# Patient Record
Sex: Female | Born: 1935 | Race: White | Hispanic: No | Marital: Married | Smoking: Never smoker
Health system: Southern US, Community
[De-identification: ages and names within clinical notes are randomized; demographics above are authoritative.]

## PROBLEM LIST (undated history)

## (undated) DIAGNOSIS — D509 Iron deficiency anemia, unspecified: Secondary | ICD-10-CM

## (undated) DIAGNOSIS — F419 Anxiety disorder, unspecified: Secondary | ICD-10-CM

## (undated) DIAGNOSIS — F32A Depression, unspecified: Secondary | ICD-10-CM

## (undated) DIAGNOSIS — C259 Malignant neoplasm of pancreas, unspecified: Secondary | ICD-10-CM

## (undated) DIAGNOSIS — E119 Type 2 diabetes mellitus without complications: Secondary | ICD-10-CM

## (undated) DIAGNOSIS — E039 Hypothyroidism, unspecified: Secondary | ICD-10-CM

## (undated) DIAGNOSIS — F329 Major depressive disorder, single episode, unspecified: Secondary | ICD-10-CM

## (undated) DIAGNOSIS — A0472 Enterocolitis due to Clostridium difficile, not specified as recurrent: Secondary | ICD-10-CM

## (undated) DIAGNOSIS — I429 Cardiomyopathy, unspecified: Secondary | ICD-10-CM

## (undated) DIAGNOSIS — D136 Benign neoplasm of pancreas: Secondary | ICD-10-CM

## (undated) DIAGNOSIS — I10 Essential (primary) hypertension: Secondary | ICD-10-CM

## (undated) HISTORY — PX: OTHER SURGICAL HISTORY: SHX169

## (undated) HISTORY — PX: ABDOMINAL HYSTERECTOMY: SHX81

## (undated) HISTORY — PX: GASTRIC BYPASS: SHX52

## (undated) HISTORY — PX: BACK SURGERY: SHX140

## (undated) HISTORY — PX: APPENDECTOMY: SHX54

## (undated) HISTORY — PX: CERVICAL FUSION: SHX112

## (undated) HISTORY — PX: CHOLECYSTECTOMY: SHX55

---

## 2004-11-10 ENCOUNTER — Ambulatory Visit: Payer: Self-pay | Admitting: Internal Medicine

## 2005-02-12 ENCOUNTER — Emergency Department: Payer: Self-pay | Admitting: Emergency Medicine

## 2005-04-14 ENCOUNTER — Ambulatory Visit: Payer: Self-pay | Admitting: Physician Assistant

## 2005-04-20 ENCOUNTER — Other Ambulatory Visit: Payer: Self-pay

## 2005-04-20 ENCOUNTER — Inpatient Hospital Stay: Payer: Self-pay | Admitting: Unknown Physician Specialty

## 2005-04-24 ENCOUNTER — Inpatient Hospital Stay: Payer: Self-pay | Admitting: Internal Medicine

## 2005-11-22 ENCOUNTER — Ambulatory Visit: Payer: Self-pay | Admitting: Internal Medicine

## 2006-10-27 ENCOUNTER — Inpatient Hospital Stay: Payer: Self-pay | Admitting: Internal Medicine

## 2006-10-27 ENCOUNTER — Other Ambulatory Visit: Payer: Self-pay

## 2007-08-10 ENCOUNTER — Ambulatory Visit: Payer: Self-pay | Admitting: Internal Medicine

## 2007-12-11 ENCOUNTER — Ambulatory Visit: Payer: Self-pay | Admitting: Internal Medicine

## 2008-02-26 ENCOUNTER — Ambulatory Visit: Payer: Self-pay | Admitting: Unknown Physician Specialty

## 2009-04-02 ENCOUNTER — Ambulatory Visit: Payer: Self-pay | Admitting: Internal Medicine

## 2009-06-17 ENCOUNTER — Emergency Department: Payer: Self-pay | Admitting: Emergency Medicine

## 2009-12-07 ENCOUNTER — Ambulatory Visit: Payer: Self-pay | Admitting: Internal Medicine

## 2010-01-17 ENCOUNTER — Emergency Department: Payer: Self-pay | Admitting: Emergency Medicine

## 2010-05-10 ENCOUNTER — Ambulatory Visit: Payer: Self-pay | Admitting: Internal Medicine

## 2010-09-21 ENCOUNTER — Ambulatory Visit: Payer: Self-pay | Admitting: Ophthalmology

## 2010-09-28 ENCOUNTER — Ambulatory Visit: Payer: Self-pay | Admitting: Internal Medicine

## 2010-10-04 ENCOUNTER — Ambulatory Visit: Payer: Self-pay | Admitting: Ophthalmology

## 2010-11-02 ENCOUNTER — Ambulatory Visit: Payer: Self-pay | Admitting: Ophthalmology

## 2010-11-08 ENCOUNTER — Ambulatory Visit: Payer: Self-pay | Admitting: Ophthalmology

## 2011-07-11 ENCOUNTER — Ambulatory Visit: Payer: Self-pay | Admitting: General Practice

## 2011-08-01 ENCOUNTER — Inpatient Hospital Stay: Payer: Self-pay | Admitting: General Practice

## 2011-08-02 LAB — BASIC METABOLIC PANEL
Anion Gap: 8 (ref 7–16)
BUN: 22 mg/dL — ABNORMAL HIGH (ref 7–18)
Calcium, Total: 7.8 mg/dL — ABNORMAL LOW (ref 8.5–10.1)
Co2: 26 mmol/L (ref 21–32)
EGFR (African American): 52 — ABNORMAL LOW
EGFR (Non-African Amer.): 43 — ABNORMAL LOW
Glucose: 91 mg/dL (ref 65–99)
Osmolality: 288 (ref 275–301)

## 2011-08-03 LAB — BASIC METABOLIC PANEL
Anion Gap: 9 (ref 7–16)
Chloride: 108 mmol/L — ABNORMAL HIGH (ref 98–107)
Creatinine: 0.87 mg/dL (ref 0.60–1.30)
EGFR (Non-African Amer.): >60
Glucose: 113 mg/dL — ABNORMAL HIGH (ref 65–99)
Osmolality: 284 (ref 275–301)
Potassium: 4.1 mmol/L (ref 3.5–5.1)
Sodium: 142 mmol/L (ref 136–145)

## 2011-08-03 LAB — PLATELET COUNT: Platelet: 200 10*3/uL (ref 150–440)

## 2011-08-05 ENCOUNTER — Encounter: Payer: Self-pay | Admitting: Internal Medicine

## 2011-08-17 ENCOUNTER — Emergency Department: Payer: Self-pay | Admitting: *Deleted

## 2011-08-17 LAB — URINALYSIS, COMPLETE
Nitrite: NEGATIVE
Ph: 5 (ref 4.5–8.0)
Protein: NEGATIVE
Squamous Epithelial: 8
Transitional Epi: <1

## 2011-08-17 LAB — COMPREHENSIVE METABOLIC PANEL
Albumin: 4 g/dL (ref 3.4–5.0)
Bilirubin,Total: 0.5 mg/dL (ref 0.2–1.0)
Creatinine: 1.69 mg/dL — ABNORMAL HIGH (ref 0.60–1.30)
EGFR (African American): 38 — ABNORMAL LOW
EGFR (Non-African Amer.): 31 — ABNORMAL LOW
Glucose: 114 mg/dL — ABNORMAL HIGH (ref 65–99)
Potassium: 3.9 mmol/L (ref 3.5–5.1)
SGPT (ALT): 16 U/L
Sodium: 138 mmol/L (ref 136–145)

## 2011-08-17 LAB — CBC
HCT: 29.1 % — ABNORMAL LOW (ref 35.0–47.0)
HGB: 9.4 g/dL — ABNORMAL LOW (ref 12.0–16.0)
MCV: 81 fL (ref 80–100)
RBC: 3.61 10*6/uL — ABNORMAL LOW (ref 3.80–5.20)
WBC: 7.9 10*3/uL (ref 3.6–11.0)

## 2011-08-17 LAB — TROPONIN I
Troponin-I: <0.02 ng/mL
Troponin-I: <0.02 ng/mL

## 2011-08-17 LAB — PROTIME-INR
INR: 1
Prothrombin Time: 13.4 secs (ref 11.5–14.7)

## 2011-08-17 LAB — CK TOTAL AND CKMB (NOT AT ARMC): CK-MB: 0.8 ng/mL (ref 0.5–3.6)

## 2011-08-17 LAB — APTT: Activated PTT: 34 secs (ref 23.6–35.9)

## 2011-08-26 ENCOUNTER — Encounter: Payer: Self-pay | Admitting: Internal Medicine

## 2011-10-17 ENCOUNTER — Ambulatory Visit: Payer: Self-pay | Admitting: Orthopedic Surgery

## 2011-10-17 LAB — CBC WITH DIFFERENTIAL/PLATELET
Basophil #: 0 10*3/uL (ref 0.0–0.1)
Eosinophil #: 0.2 10*3/uL (ref 0.0–0.7)
Eosinophil %: 2.4 %
HCT: 27.4 % — ABNORMAL LOW (ref 35.0–47.0)
HGB: 8.8 g/dL — ABNORMAL LOW (ref 12.0–16.0)
Lymphocyte #: 1.9 10*3/uL (ref 1.0–3.6)
MCH: 24.1 pg — ABNORMAL LOW (ref 26.0–34.0)
MCV: 75 fL — ABNORMAL LOW (ref 80–100)
Monocyte %: 8.6 %
Neutrophil #: 4.1 10*3/uL (ref 1.4–6.5)
Platelet: 342 10*3/uL (ref 150–440)
RDW: 15.1 % — ABNORMAL HIGH (ref 11.5–14.5)
WBC: 6.8 10*3/uL (ref 3.6–11.0)

## 2011-10-17 LAB — BASIC METABOLIC PANEL
Anion Gap: 10 (ref 7–16)
BUN: 25 mg/dL — ABNORMAL HIGH (ref 7–18)
Calcium, Total: 8.5 mg/dL (ref 8.5–10.1)
Chloride: 105 mmol/L (ref 98–107)
Co2: 26 mmol/L (ref 21–32)
Creatinine: 1.4 mg/dL — ABNORMAL HIGH (ref 0.60–1.30)
EGFR (Non-African Amer.): 39 — ABNORMAL LOW
Glucose: 129 mg/dL — ABNORMAL HIGH (ref 65–99)
Sodium: 141 mmol/L (ref 136–145)

## 2011-10-18 ENCOUNTER — Ambulatory Visit: Payer: Self-pay | Admitting: Orthopedic Surgery

## 2012-01-17 ENCOUNTER — Ambulatory Visit: Payer: Self-pay | Admitting: Internal Medicine

## 2012-01-19 ENCOUNTER — Ambulatory Visit: Payer: Self-pay | Admitting: Internal Medicine

## 2012-02-03 ENCOUNTER — Ambulatory Visit: Payer: Self-pay | Admitting: Internal Medicine

## 2012-08-08 ENCOUNTER — Ambulatory Visit: Payer: Self-pay | Admitting: Internal Medicine

## 2012-09-07 ENCOUNTER — Ambulatory Visit: Payer: Self-pay | Admitting: Radiation Oncology

## 2012-09-10 ENCOUNTER — Ambulatory Visit: Payer: Self-pay | Admitting: Radiation Oncology

## 2012-09-21 ENCOUNTER — Ambulatory Visit: Payer: Self-pay | Admitting: Internal Medicine

## 2012-09-22 ENCOUNTER — Ambulatory Visit: Payer: Self-pay | Admitting: Radiation Oncology

## 2012-09-25 LAB — CBC CANCER CENTER
Basophil %: 1.4 %
Eosinophil #: 0.2 x10 3/mm (ref 0.0–0.7)
Eosinophil %: 3 %
HCT: 32.7 % — ABNORMAL LOW (ref 35.0–47.0)
HGB: 10.8 g/dL — ABNORMAL LOW (ref 12.0–16.0)
Lymphocyte #: 1.8 x10 3/mm (ref 1.0–3.6)
MCHC: 33.2 g/dL (ref 32.0–36.0)
MCV: 82 fL (ref 80–100)
Monocyte #: 0.6 x10 3/mm (ref 0.2–0.9)
Monocyte %: 8.4 %
Neutrophil #: 4.1 x10 3/mm (ref 1.4–6.5)
Platelet: 287 x10 3/mm (ref 150–440)
RBC: 4.01 10*6/uL (ref 3.80–5.20)
RDW: 15 % — ABNORMAL HIGH (ref 11.5–14.5)
WBC: 6.8 x10 3/mm (ref 3.6–11.0)

## 2012-09-25 LAB — PROTIME-INR
INR: 0.9
Prothrombin Time: 12.6 secs (ref 11.5–14.7)

## 2012-09-25 LAB — COMPREHENSIVE METABOLIC PANEL
Albumin: 2.7 g/dL — ABNORMAL LOW (ref 3.4–5.0)
Alkaline Phosphatase: 101 U/L (ref 50–136)
Anion Gap: 9 (ref 7–16)
BUN: 10 mg/dL (ref 7–18)
Calcium, Total: 8.3 mg/dL — ABNORMAL LOW (ref 8.5–10.1)
Chloride: 101 mmol/L (ref 98–107)
Co2: 30 mmol/L (ref 21–32)
EGFR (African American): 48 — ABNORMAL LOW
Glucose: 183 mg/dL — ABNORMAL HIGH (ref 65–99)
Osmolality: 283 (ref 275–301)
SGOT(AST): 38 U/L — ABNORMAL HIGH (ref 15–37)
SGPT (ALT): 32 U/L (ref 12–78)
Sodium: 140 mmol/L (ref 136–145)
Total Protein: 6.7 g/dL (ref 6.4–8.2)

## 2012-10-23 ENCOUNTER — Ambulatory Visit: Payer: Self-pay | Admitting: Internal Medicine

## 2012-10-23 ENCOUNTER — Ambulatory Visit: Payer: Self-pay | Admitting: Radiation Oncology

## 2012-12-02 ENCOUNTER — Inpatient Hospital Stay (HOSPITAL_COMMUNITY)
Admission: EM | Admit: 2012-12-02 | Discharge: 2012-12-07 | DRG: 208 | Disposition: A | Discharge status: Home Health | Payer: Medicare Other | Attending: Internal Medicine | Admitting: Internal Medicine

## 2012-12-02 ENCOUNTER — Encounter (HOSPITAL_COMMUNITY): Payer: Self-pay | Admitting: *Deleted

## 2012-12-02 ENCOUNTER — Encounter (HOSPITAL_COMMUNITY)
Admission: EM | Disposition: A | Discharge status: Home Health | Payer: Self-pay | Source: Home / Self Care | Attending: Pulmonary Disease

## 2012-12-02 ENCOUNTER — Emergency Department (HOSPITAL_COMMUNITY): Payer: Medicare Other

## 2012-12-02 ENCOUNTER — Ambulatory Visit (HOSPITAL_COMMUNITY): Admit: 2012-12-02 | Payer: Self-pay | Admitting: Cardiovascular Disease

## 2012-12-02 DIAGNOSIS — I10 Essential (primary) hypertension: Secondary | ICD-10-CM | POA: Diagnosis present

## 2012-12-02 DIAGNOSIS — J811 Chronic pulmonary edema: Secondary | ICD-10-CM

## 2012-12-02 DIAGNOSIS — N390 Urinary tract infection, site not specified: Secondary | ICD-10-CM | POA: Diagnosis present

## 2012-12-02 DIAGNOSIS — E119 Type 2 diabetes mellitus without complications: Secondary | ICD-10-CM

## 2012-12-02 DIAGNOSIS — Z79899 Other long term (current) drug therapy: Secondary | ICD-10-CM

## 2012-12-02 DIAGNOSIS — E039 Hypothyroidism, unspecified: Secondary | ICD-10-CM

## 2012-12-02 DIAGNOSIS — E876 Hypokalemia: Secondary | ICD-10-CM

## 2012-12-02 DIAGNOSIS — I5031 Acute diastolic (congestive) heart failure: Secondary | ICD-10-CM

## 2012-12-02 DIAGNOSIS — E872 Acidosis, unspecified: Secondary | ICD-10-CM | POA: Diagnosis present

## 2012-12-02 DIAGNOSIS — J9600 Acute respiratory failure, unspecified whether with hypoxia or hypercapnia: Principal | ICD-10-CM | POA: Diagnosis present

## 2012-12-02 DIAGNOSIS — J189 Pneumonia, unspecified organism: Secondary | ICD-10-CM

## 2012-12-02 DIAGNOSIS — F411 Generalized anxiety disorder: Secondary | ICD-10-CM | POA: Diagnosis present

## 2012-12-02 DIAGNOSIS — A0472 Enterocolitis due to Clostridium difficile, not specified as recurrent: Secondary | ICD-10-CM | POA: Diagnosis present

## 2012-12-02 DIAGNOSIS — I428 Other cardiomyopathies: Secondary | ICD-10-CM | POA: Diagnosis present

## 2012-12-02 DIAGNOSIS — J9690 Respiratory failure, unspecified, unspecified whether with hypoxia or hypercapnia: Secondary | ICD-10-CM

## 2012-12-02 DIAGNOSIS — C259 Malignant neoplasm of pancreas, unspecified: Secondary | ICD-10-CM

## 2012-12-02 DIAGNOSIS — I509 Heart failure, unspecified: Secondary | ICD-10-CM | POA: Diagnosis present

## 2012-12-02 DIAGNOSIS — Z96659 Presence of unspecified artificial knee joint: Secondary | ICD-10-CM

## 2012-12-02 DIAGNOSIS — J9601 Acute respiratory failure with hypoxia: Secondary | ICD-10-CM

## 2012-12-02 DIAGNOSIS — J810 Acute pulmonary edema: Secondary | ICD-10-CM

## 2012-12-02 DIAGNOSIS — Z9884 Bariatric surgery status: Secondary | ICD-10-CM

## 2012-12-02 DIAGNOSIS — F329 Major depressive disorder, single episode, unspecified: Secondary | ICD-10-CM | POA: Diagnosis present

## 2012-12-02 DIAGNOSIS — I5043 Acute on chronic combined systolic (congestive) and diastolic (congestive) heart failure: Secondary | ICD-10-CM

## 2012-12-02 DIAGNOSIS — F3289 Other specified depressive episodes: Secondary | ICD-10-CM | POA: Diagnosis present

## 2012-12-02 DIAGNOSIS — I214 Non-ST elevation (NSTEMI) myocardial infarction: Secondary | ICD-10-CM

## 2012-12-02 DIAGNOSIS — D509 Iron deficiency anemia, unspecified: Secondary | ICD-10-CM | POA: Diagnosis present

## 2012-12-02 DIAGNOSIS — Z792 Long term (current) use of antibiotics: Secondary | ICD-10-CM

## 2012-12-02 HISTORY — DX: Cardiomyopathy, unspecified: I42.9

## 2012-12-02 HISTORY — DX: Type 2 diabetes mellitus without complications: E11.9

## 2012-12-02 HISTORY — DX: Benign neoplasm of pancreas: D13.6

## 2012-12-02 HISTORY — DX: Hypothyroidism, unspecified: E03.9

## 2012-12-02 HISTORY — DX: Malignant neoplasm of pancreas, unspecified: C25.9

## 2012-12-02 HISTORY — DX: Major depressive disorder, single episode, unspecified: F32.9

## 2012-12-02 HISTORY — DX: Depression, unspecified: F32.A

## 2012-12-02 HISTORY — DX: Essential (primary) hypertension: I10

## 2012-12-02 HISTORY — DX: Enterocolitis due to Clostridium difficile, not specified as recurrent: A04.72

## 2012-12-02 HISTORY — DX: Anxiety disorder, unspecified: F41.9

## 2012-12-02 HISTORY — DX: Iron deficiency anemia, unspecified: D50.9

## 2012-12-02 SURGERY — LEFT HEART CATHETERIZATION WITH CORONARY ANGIOGRAM
Anesthesia: LOCAL

## 2012-12-02 MED ORDER — SODIUM CHLORIDE 0.9 % IV SOLN
INTRAVENOUS | Status: DC
  Administered 2012-12-03: 01:00:00 via INTRAVENOUS

## 2012-12-02 MED ORDER — MIDAZOLAM BOLUS VIA INFUSION
1.0000 mg | INTRAVENOUS | Status: DC | PRN
  Filled 2012-12-02: qty 2

## 2012-12-02 MED ORDER — SUCCINYLCHOLINE CHLORIDE 20 MG/ML IJ SOLN
INTRAMUSCULAR | Status: AC
  Administered 2012-12-02: 100 mg
  Filled 2012-12-02: qty 1

## 2012-12-02 MED ORDER — ROCURONIUM BROMIDE 50 MG/5ML IV SOLN
INTRAVENOUS | Status: AC
  Filled 2012-12-02: qty 2

## 2012-12-02 MED ORDER — FENTANYL BOLUS VIA INFUSION
25.0000 ug | Freq: Four times a day (QID) | INTRAVENOUS | Status: DC | PRN
  Filled 2012-12-02: qty 100

## 2012-12-02 MED ORDER — FENTANYL CITRATE 0.05 MG/ML IJ SOLN
INTRAMUSCULAR | Status: AC
  Administered 2012-12-02: 100 ug
  Filled 2012-12-02: qty 2

## 2012-12-02 MED ORDER — SODIUM CHLORIDE 0.9 % IV SOLN
1.0000 mg/h | INTRAVENOUS | Status: DC
  Administered 2012-12-03: 1 mg/h via INTRAVENOUS
  Filled 2012-12-02 (×2): qty 10

## 2012-12-02 MED ORDER — MIDAZOLAM HCL 2 MG/2ML IJ SOLN
INTRAMUSCULAR | Status: AC
  Administered 2012-12-02: 2 mg
  Filled 2012-12-02: qty 2

## 2012-12-02 MED ORDER — FENTANYL CITRATE 0.05 MG/ML IJ SOLN
INTRAMUSCULAR | Status: AC
  Filled 2012-12-02: qty 2

## 2012-12-02 MED ORDER — LIDOCAINE HCL (CARDIAC) 20 MG/ML IV SOLN
INTRAVENOUS | Status: AC
  Filled 2012-12-02: qty 5

## 2012-12-02 MED ORDER — SODIUM CHLORIDE 0.9 % IV SOLN
25.0000 ug/h | INTRAVENOUS | Status: DC
  Administered 2012-12-03: 50 ug/h via INTRAVENOUS
  Filled 2012-12-02 (×2): qty 50

## 2012-12-02 MED ORDER — ETOMIDATE 2 MG/ML IV SOLN
INTRAVENOUS | Status: AC
  Administered 2012-12-02: 20 mg
  Filled 2012-12-02: qty 20

## 2012-12-02 NOTE — ED Notes (Signed)
Porta=cath   Accessed by [employer]

## 2012-12-02 NOTE — ED Notes (Signed)
succ 100mg  iv per jon rn

## 2012-12-02 NOTE — ED Notes (Signed)
Intubated bu the resp therapist.  Dr Bernette Mayers at bedside.  Good breath sounds bi-lateral breath sounds

## 2012-12-02 NOTE — ED Notes (Signed)
Preparing for intubation.  Dr Bernette Mayers

## 2012-12-02 NOTE — ED Notes (Signed)
amidate 20 mg iv per jon rn

## 2012-12-02 NOTE — ED Notes (Signed)
The pt arrived by gems from home sob and  And pain since 2130.  She arrived on c-pap resp therapy at bed side.  Cards in room.  stemi called by gems.  lethargic

## 2012-12-03 ENCOUNTER — Encounter (HOSPITAL_COMMUNITY): Payer: Self-pay | Admitting: Internal Medicine

## 2012-12-03 DIAGNOSIS — J189 Pneumonia, unspecified organism: Secondary | ICD-10-CM | POA: Diagnosis present

## 2012-12-03 DIAGNOSIS — E119 Type 2 diabetes mellitus without complications: Secondary | ICD-10-CM

## 2012-12-03 DIAGNOSIS — C259 Malignant neoplasm of pancreas, unspecified: Secondary | ICD-10-CM

## 2012-12-03 DIAGNOSIS — E039 Hypothyroidism, unspecified: Secondary | ICD-10-CM

## 2012-12-03 DIAGNOSIS — J9601 Acute respiratory failure with hypoxia: Secondary | ICD-10-CM | POA: Diagnosis present

## 2012-12-03 DIAGNOSIS — J9600 Acute respiratory failure, unspecified whether with hypoxia or hypercapnia: Secondary | ICD-10-CM

## 2012-12-03 DIAGNOSIS — I5042 Chronic combined systolic (congestive) and diastolic (congestive) heart failure: Secondary | ICD-10-CM

## 2012-12-03 DIAGNOSIS — J810 Acute pulmonary edema: Secondary | ICD-10-CM

## 2012-12-03 DIAGNOSIS — I509 Heart failure, unspecified: Secondary | ICD-10-CM

## 2012-12-03 HISTORY — DX: Hypothyroidism, unspecified: E03.9

## 2012-12-03 HISTORY — DX: Type 2 diabetes mellitus without complications: E11.9

## 2012-12-03 HISTORY — DX: Malignant neoplasm of pancreas, unspecified: C25.9

## 2012-12-03 LAB — TYPE AND SCREEN: DAT, IgG: NEGATIVE

## 2012-12-03 LAB — URINALYSIS, ROUTINE W REFLEX MICROSCOPIC
Ketones, ur: NEGATIVE mg/dL
Leukocytes, UA: NEGATIVE
Nitrite: NEGATIVE
Protein, ur: 100 mg/dL — AB

## 2012-12-03 LAB — COMPREHENSIVE METABOLIC PANEL WITH GFR
ALT: 30 U/L (ref 0–35)
AST: 42 U/L — ABNORMAL HIGH (ref 0–37)
Albumin: 2.3 g/dL — ABNORMAL LOW (ref 3.5–5.2)
Alkaline Phosphatase: 118 U/L — ABNORMAL HIGH (ref 39–117)
BUN: 12 mg/dL (ref 6–23)
CO2: 19 meq/L (ref 19–32)
Calcium: 8.3 mg/dL — ABNORMAL LOW (ref 8.4–10.5)
Chloride: 100 meq/L (ref 96–112)
Creatinine, Ser: 0.94 mg/dL (ref 0.50–1.10)
GFR calc Af Amer: 67 mL/min — ABNORMAL LOW
GFR calc non Af Amer: 57 mL/min — ABNORMAL LOW
Glucose, Bld: 286 mg/dL — ABNORMAL HIGH (ref 70–99)
Potassium: 4.2 meq/L (ref 3.5–5.1)
Sodium: 136 meq/L (ref 135–145)
Total Bilirubin: 0.4 mg/dL (ref 0.3–1.2)
Total Protein: 6.1 g/dL (ref 6.0–8.3)

## 2012-12-03 LAB — COMPREHENSIVE METABOLIC PANEL
AST: 51 U/L — ABNORMAL HIGH (ref 0–37)
Alkaline Phosphatase: 86 U/L (ref 39–117)
BUN: 12 mg/dL (ref 6–23)
CO2: 28 mEq/L (ref 19–32)
Chloride: 105 mEq/L (ref 96–112)
Creatinine, Ser: 1 mg/dL (ref 0.50–1.10)
GFR calc non Af Amer: 53 mL/min — ABNORMAL LOW (ref >90)
Potassium: 3.7 mEq/L (ref 3.5–5.1)
Total Bilirubin: 0.4 mg/dL (ref 0.3–1.2)

## 2012-12-03 LAB — POCT I-STAT 3, ART BLOOD GAS (G3+)
Acid-base deficit: 3 mmol/L — ABNORMAL HIGH (ref 0.0–2.0)
Patient temperature: 98.3
pH, Arterial: 7.295 — ABNORMAL LOW (ref 7.350–7.450)
pO2, Arterial: 78 mmHg — ABNORMAL LOW (ref 80.0–100.0)

## 2012-12-03 LAB — CBC WITH DIFFERENTIAL/PLATELET
Eosinophils Absolute: 0.3 10*3/uL (ref 0.0–0.7)
HCT: 31.2 % — ABNORMAL LOW (ref 36.0–46.0)
Hemoglobin: 9.9 g/dL — ABNORMAL LOW (ref 12.0–15.0)
Lymphs Abs: 6 10*3/uL — ABNORMAL HIGH (ref 0.7–4.0)
MCH: 26.1 pg (ref 26.0–34.0)
Monocytes Relative: 14 % — ABNORMAL HIGH (ref 3–12)
Neutro Abs: 4.3 10*3/uL (ref 1.7–7.7)
Neutrophils Relative %: 35 % — ABNORMAL LOW (ref 43–77)
RBC: 3.79 MIL/uL — ABNORMAL LOW (ref 3.87–5.11)

## 2012-12-03 LAB — CBC
HCT: 24.4 % — ABNORMAL LOW (ref 36.0–46.0)
MCV: 80.5 fL (ref 78.0–100.0)
RBC: 3.03 MIL/uL — ABNORMAL LOW (ref 3.87–5.11)
WBC: 5.3 10*3/uL (ref 4.0–10.5)

## 2012-12-03 LAB — LACTIC ACID, PLASMA: Lactic Acid, Venous: 1.9 mmol/L (ref 0.5–2.2)

## 2012-12-03 LAB — GLUCOSE, CAPILLARY: Glucose-Capillary: 94 mg/dL (ref 70–99)

## 2012-12-03 LAB — PROCALCITONIN: Procalcitonin: 0.93 ng/mL

## 2012-12-03 LAB — CLOSTRIDIUM DIFFICILE BY PCR: Toxigenic C. Difficile by PCR: NEGATIVE

## 2012-12-03 LAB — PRO B NATRIURETIC PEPTIDE: Pro B Natriuretic peptide (BNP): 26162 pg/mL — ABNORMAL HIGH (ref 0–450)

## 2012-12-03 LAB — URINE MICROSCOPIC-ADD ON

## 2012-12-03 LAB — PROTIME-INR: INR: 1.19 (ref 0.00–1.49)

## 2012-12-03 MED ORDER — PANTOPRAZOLE SODIUM 40 MG IV SOLR
40.0000 mg | Freq: Every day | INTRAVENOUS | Status: DC
  Filled 2012-12-03: qty 40

## 2012-12-03 MED ORDER — FAMOTIDINE 40 MG/5ML PO SUSR
20.0000 mg | Freq: Two times a day (BID) | ORAL | Status: DC
  Administered 2012-12-03 – 2012-12-04 (×3): 20 mg via ORAL
  Filled 2012-12-03 (×4): qty 2.5

## 2012-12-03 MED ORDER — VANCOMYCIN 50 MG/ML ORAL SOLUTION
125.0000 mg | Freq: Four times a day (QID) | ORAL | Status: DC
  Administered 2012-12-03 – 2012-12-06 (×13): 125 mg via NASOGASTRIC
  Filled 2012-12-03 (×19): qty 2.5

## 2012-12-03 MED ORDER — VANCOMYCIN HCL IN DEXTROSE 750-5 MG/150ML-% IV SOLN
750.0000 mg | Freq: Two times a day (BID) | INTRAVENOUS | Status: DC
  Filled 2012-12-03: qty 150

## 2012-12-03 MED ORDER — PIPERACILLIN-TAZOBACTAM 3.375 G IVPB
3.3750 g | Freq: Three times a day (TID) | INTRAVENOUS | Status: DC
  Administered 2012-12-03 – 2012-12-07 (×13): 3.375 g via INTRAVENOUS
  Filled 2012-12-03 (×16): qty 50

## 2012-12-03 MED ORDER — ARIPIPRAZOLE 5 MG PO TABS
5.0000 mg | ORAL_TABLET | Freq: Every day | ORAL | Status: DC
  Administered 2012-12-03 – 2012-12-07 (×5): 5 mg via ORAL
  Filled 2012-12-03 (×5): qty 1

## 2012-12-03 MED ORDER — CLONAZEPAM 0.5 MG PO TABS
0.5000 mg | ORAL_TABLET | Freq: Two times a day (BID) | ORAL | Status: DC | PRN
  Administered 2012-12-04 – 2012-12-06 (×3): 0.5 mg via ORAL
  Filled 2012-12-03 (×3): qty 1

## 2012-12-03 MED ORDER — LEVOTHYROXINE SODIUM 112 MCG PO TABS
112.0000 ug | ORAL_TABLET | Freq: Every day | ORAL | Status: DC
  Administered 2012-12-03 – 2012-12-07 (×5): 112 ug via NASOGASTRIC
  Filled 2012-12-03 (×7): qty 1

## 2012-12-03 MED ORDER — PIPERACILLIN-TAZOBACTAM 3.375 G IVPB
3.3750 g | Freq: Once | INTRAVENOUS | Status: AC
  Administered 2012-12-03: 3.375 g via INTRAVENOUS
  Filled 2012-12-03: qty 50

## 2012-12-03 MED ORDER — HEPARIN SODIUM (PORCINE) 5000 UNIT/ML IJ SOLN
5000.0000 [IU] | Freq: Three times a day (TID) | INTRAMUSCULAR | Status: DC
  Administered 2012-12-03 – 2012-12-07 (×13): 5000 [IU] via SUBCUTANEOUS
  Filled 2012-12-03 (×16): qty 1

## 2012-12-03 MED ORDER — SODIUM CHLORIDE 0.9 % IV SOLN
250.0000 mL | INTRAVENOUS | Status: DC | PRN
  Administered 2012-12-03: 500 mL via INTRAVENOUS
  Administered 2012-12-05: 250 mL via INTRAVENOUS

## 2012-12-03 MED ORDER — BIOTENE DRY MOUTH MT LIQD
15.0000 mL | Freq: Four times a day (QID) | OROMUCOSAL | Status: DC
  Administered 2012-12-04 (×2): 15 mL via OROMUCOSAL

## 2012-12-03 MED ORDER — CHLORHEXIDINE GLUCONATE CLOTH 2 % EX PADS
6.0000 | MEDICATED_PAD | Freq: Every day | CUTANEOUS | Status: AC
  Administered 2012-12-03 – 2012-12-07 (×5): 6 via TOPICAL

## 2012-12-03 MED ORDER — FUROSEMIDE 10 MG/ML IJ SOLN
80.0000 mg | Freq: Once | INTRAMUSCULAR | Status: AC
  Administered 2012-12-03: 80 mg via INTRAVENOUS
  Filled 2012-12-03: qty 8

## 2012-12-03 MED ORDER — ALBUTEROL SULFATE HFA 108 (90 BASE) MCG/ACT IN AERS: 4.0000 | INHALATION_SPRAY | RESPIRATORY_TRACT | Status: DC | PRN

## 2012-12-03 MED ORDER — MUPIROCIN 2 % EX OINT
1.0000 "application " | TOPICAL_OINTMENT | Freq: Two times a day (BID) | CUTANEOUS | Status: DC
  Administered 2012-12-03 – 2012-12-07 (×8): 1 via NASAL
  Filled 2012-12-03: qty 22

## 2012-12-03 MED ORDER — VANCOMYCIN HCL 500 MG IV SOLR
500.0000 mg | Freq: Two times a day (BID) | INTRAVENOUS | Status: DC
  Administered 2012-12-03 – 2012-12-05 (×4): 500 mg via INTRAVENOUS
  Filled 2012-12-03 (×6): qty 500

## 2012-12-03 MED ORDER — VANCOMYCIN HCL IN DEXTROSE 1-5 GM/200ML-% IV SOLN
1000.0000 mg | Freq: Once | INTRAVENOUS | Status: AC
  Administered 2012-12-03: 1000 mg via INTRAVENOUS
  Filled 2012-12-03: qty 200

## 2012-12-03 MED ORDER — FENTANYL CITRATE 0.05 MG/ML IJ SOLN
25.0000 ug | INTRAMUSCULAR | Status: DC | PRN
  Administered 2012-12-03: 100 ug via INTRAVENOUS
  Administered 2012-12-04: 50 ug via INTRAVENOUS
  Administered 2012-12-04 (×2): 100 ug via INTRAVENOUS
  Filled 2012-12-03 (×4): qty 2

## 2012-12-03 MED ORDER — FUROSEMIDE 10 MG/ML IJ SOLN
40.0000 mg | Freq: Two times a day (BID) | INTRAMUSCULAR | Status: DC
  Administered 2012-12-03 – 2012-12-05 (×4): 40 mg via INTRAVENOUS
  Filled 2012-12-03 (×6): qty 4

## 2012-12-03 MED ORDER — CHLORHEXIDINE GLUCONATE 0.12 % MT SOLN
15.0000 mL | Freq: Two times a day (BID) | OROMUCOSAL | Status: DC
  Administered 2012-12-03 – 2012-12-04 (×2): 15 mL via OROMUCOSAL
  Filled 2012-12-03 (×2): qty 15

## 2012-12-03 MED ORDER — INSULIN ASPART 100 UNIT/ML ~~LOC~~ SOLN
2.0000 [IU] | SUBCUTANEOUS | Status: DC
  Administered 2012-12-03 – 2012-12-04 (×2): 4 [IU] via SUBCUTANEOUS
  Administered 2012-12-05: 2 [IU] via SUBCUTANEOUS
  Administered 2012-12-05 – 2012-12-06 (×4): 4 [IU] via SUBCUTANEOUS

## 2012-12-03 NOTE — H&P (Signed)
PULMONARY  / CRITICAL CARE MEDICINE  Name: Tonya Wallace MRN: [id number] DOB: Apr 24, 1935    ADMISSION DATE:  12/02/2012  PRIMARY SERVICE: PCCM  CHIEF COMPLAINT:  Acute respiratory failure.  BRIEF PATIENT DESCRIPTION:  78 years old female with pancreatic cancer. Presents with sudden onset of SOB. At admission found profoundly hypoxemic requiring intubation. Chest X ray with bilateral infiltrates.    LINES / TUBES: - Peripheral IV's - Right port- A-cath  CULTURES: - Blood cultures sent - Urine cultures sent - Will order respiratory cultures.   ANTIBIOTICS: - Zosyn - Vancomycin  HISTORY OF PRESENT ILLNESS:   78 years old female with PMH relevant for pancreatic cancer, DM, hypothyroidism and depression. Presents with sudden onset of SOB. Famile denies any cough, sputum production or fever. At arrival to the ED profoundly hypoxemic requiring intubation. Chest X ray with bilateral infiltrates suggestive of multifocal pneumonia vs CHF. BNP [id number]. At the time of my exam the patient is intubated, sedated, hemodynamically stable. Got Lasix 80 mg IV in the ED.  PAST MEDICAL HISTORY :  Past Medical History  Diagnosis Date  . Pancreatic adenoma   . Pancreatic cancer 12/03/2012  . Diabetes mellitus 12/03/2012  . Hypothyroidism 12/03/2012  . Clostridium difficile enterocolitis   . Depression    No past surgical history on file. Prior to Admission medications   Medication Sig Start Date End Date Taking? Authorizing Provider  aliskiren (TEKTURNA) 300 MG tablet Take 300 mg by mouth daily.   Yes Historical Provider, MD  Alum & Mag Hydroxide-Simeth (MAGIC MOUTHWASH) SOLN Take 15 mLs by mouth 4 (four) times daily.   Yes Historical Provider, MD  ARIPiprazole (ABILIFY) 5 MG tablet Take 5 mg by mouth daily.   Yes Historical Provider, MD  ciprofloxacin (CIPRO) 500 MG tablet Take 500 mg by mouth every 12 (twelve) hours. For 13 days 11/28/12 12/12/12 Yes Historical Provider, MD  clonazePAM  (KLONOPIN) 0.5 MG tablet Take 0.5 mg by mouth 2 (two) times daily as needed for anxiety.   Yes Historical Provider, MD  diphenoxylate-atropine (LOMOTIL) 2.5-0.025 MG per tablet Take 1 tablet by mouth 4 (four) times daily as needed for diarrhea or loose stools.   Yes Historical Provider, MD  escitalopram (LEXAPRO) 10 MG tablet Take 10 mg by mouth daily.   Yes Historical Provider, MD  furosemide (LASIX) 20 MG tablet Take 20 mg by mouth daily.   Yes Historical Provider, MD  levothyroxine (SYNTHROID, LEVOTHROID) 112 MCG tablet Take 112 mcg by mouth daily before breakfast.   Yes Historical Provider, MD  lidocaine-prilocaine (EMLA) cream Apply 1 application topically as needed (Apply 30 gm 45 minutes before port).   Yes Historical Provider, MD  lipase/protease/amylase (CREON-12/PANCREASE) 12000 UNITS CPEP Take 1 capsule by mouth 3 (three) times daily with meals.   Yes Historical Provider, MD  Multiple Vitamin (MULTIVITAMIN WITH MINERALS) TABS Take 1 tablet by mouth daily.   Yes Historical Provider, MD  nebivolol (BYSTOLIC) 5 MG tablet Take 5 mg by mouth daily.   Yes Historical Provider, MD  ondansetron (ZOFRAN) 8 MG tablet Take by mouth every 8 (eight) hours as needed for nausea.   Yes Historical Provider, MD  solifenacin (VESICARE) 10 MG tablet Take 10 mg by mouth daily.   Yes Historical Provider, MD  traMADol (ULTRAM) 50 MG tablet Take 50 mg by mouth every 6 (six) hours as needed for pain.   Yes Historical Provider, MD  Vancomycin HCl 25 MG/ML SOLN Take 125 mg by mouth every 6 (six)  hours. For 21 days 11/28/12 12/19/12 Yes Historical Provider, MD  zolpidem (AMBIEN CR) 6.25 MG CR tablet Take 6.25 mg by mouth at bedtime as needed for sleep.   Yes Historical Provider, MD   Allergies  Allergen Reactions  . Fentanyl Itching    FAMILY HISTORY:  No family history on file. SOCIAL HISTORY:  reports that she has never smoked. She does not have any smokeless tobacco history on file. She reports that she does  not drink alcohol. Her drug history is not on file.  REVIEW OF SYSTEMS:  Unable to provide  SUBJECTIVE:   VITAL SIGNS: Temp:  [98.1 F (36.7 C)-98.9 F (37.2 C)] 98.4 F (36.9 C) (05/12 0236) Pulse Rate:  [76-134] 76 (05/12 0236) Resp:  [28-33] 28 (05/11 2320) BP: (88-181)/(47-100) 114/59 mmHg (05/12 0236) SpO2:  [60 %-99 %] 99 % (05/12 0236) FiO2 (%):  [100 %] 100 % (05/11 2320) HEMODYNAMICS:   VENTILATOR SETTINGS: Vent Mode:  [-] PRVC FiO2 (%):  [100 %] 100 % Set Rate:  [16 bmp] 16 bmp Vt Set:  [370 mL] 370 mL PEEP:  [8 cmH20] 8 cmH20 Plateau Pressure:  [24 cmH20] 24 cmH20 INTAKE / OUTPUT: Intake/Output   None     PHYSICAL EXAMINATION: General: No apparent distress. Intubated. Eyes: Anicteric sclerae. ENT: ETT in place. Trachea at midline. Lymph: No cervical, supraclavicular, or axillary lymphadenopathy. Heart: Normal S1, S2. No murmurs, rubs, or gallops appreciated. No bruits, equal pulses. Lungs: Bilateral diffuse crackles. No wheezing. Abdomen: Abdomen soft, non-tender and not distended, normoactive bowel sounds. Biliary drainage in place.  Musculoskeletal: No clubbing or synovitis. 3+ LE edema. Skin: No rashes or lesions Neuro: Sedated. Open eyes on voice command. Moves all 4 extremities. LABS:  Recent Labs Lab 12/02/12 2349 12/03/12 0031  HGB 9.9*  --   WBC 12.4*  --   PLT 330  --   NA 136  --   K 4.2  --   CL 100  --   CO2 19  --   GLUCOSE 286*  --   BUN 12  --   CREATININE 0.94  --   CALCIUM 8.3*  --   AST 42*  --   ALT 30  --   ALKPHOS 118*  --   BILITOT 0.4  --   PROT 6.1  --   ALBUMIN 2.3*  --   APTT 32  --   INR 1.19  --   TROPONINI <0.30  --   PROBNP 26162.0*  --   PHART  --  7.295*  PCO2ART  --  48.1*  PO2ART  --  78.0*   No results found for this basename: GLUCAP,  in the last 168 hours  CXR:  Bilateral patchy infiltrates worse on the right. Likely pneumonia but cannot rule out CHF.   ASSESSMENT / PLAN:  PULMONARY A: 1)  Acute hypoxemic respiratory failure / ARDS 2) HCAP but unable to rule out pulmonary edema given markedly high BNP. Bedside US with depressed LVEF probably about  40% P:   - Admit to ICU - Mechanical ventilation   - PRVC, Vt: 6cc/kg, PEEP: 8, RR: 18, FiO2: 100% and adjust to keep O2 sat > 94% - VAP prevention order set - Zosyn - Vancomycin - Tracheal aspirate cultures - Albuterol PRN  CARDIOVASCULAR A:  1) Acute decompensated heart failure.  2) Hypertensive with SBP in the 180's at admission. P:  - Lasix 40 mg IV BID if BP tolerates - Echocardiogram in am  RENAL  A:   1) No issues P:   - Will follow CMP  GASTROINTESTINAL A:   1) Pancreatic cancer 2) History of C. Diff on PO vancomycin P:   - GI prophylaxis with protonix IV - We will continue vancomycin per NGT  HEMATOLOGIC A:   1) No issues P:  - DVT prophylaxis with SQ heparin and SCD's - Will follow CBC  INFECTIOUS A:   1) HCAP P:   - Antibiotics as above - We will follow cultures  ENDOCRINE A:   1) DM 2) Hypothyroidism P:   - ICU hyperglycemia protocol with SQ insulin SS  NEUROLOGIC A:   1) No issues P:   - Continuous sedation with Versed and Fentanyl   I have personally obtained a history, examined the patient, evaluated laboratory and imaging results, formulated the assessment and plan and placed orders. CRITICAL CARE: Critical Care Time devoted to patient care services described in this note is 60 minutes.   Overton Mam, MD Pulmonary and Critical Care Medicine [facility] Pager: [phone]  12/03/2012, 3:02 AM

## 2012-12-03 NOTE — ED Notes (Signed)
The pt s family at the bedside husband and care giver.  The pt is currently getting chemo therapy.  Tonight she had abd pain and sob earlier.  Foley cath placed temp foley  Urine sent for routine and culture

## 2012-12-03 NOTE — Progress Notes (Signed)
CRITICAL VALUE ALERT  Critical value received:  troponin  Date of notification:  12/03/12  Time of notification:  1815  Critical value read back:yes  Nurse who received alert:  D. Robb Matar   MD notified (1st page):  Delford Field  Time of first page:  1815  MD notified (2nd page):  Time of second page:  Responding MD:  Delford Field  Time MD responded:  [phone]

## 2012-12-03 NOTE — Progress Notes (Signed)
CRITICAL VALUE ALERT  Critical value received:  PCR MRSA nasal swab positive  Date of notification:  12/03/12  Time of notification:  0547  Critical value read back: yes  Nurse who received alert:  Dustin Flock RN  MD notified (1st page):  Dr Deterding  Time of first page:  0615  MD notified (2nd page):  Time of second page:  Responding MD:  Dr Darrick Penna  Time MD responded:  [phone]

## 2012-12-03 NOTE — ED Notes (Signed)
Family at the bedside . The ptsd

## 2012-12-03 NOTE — Progress Notes (Addendum)
Pt has already been seen in consultation this afternoon.   She presented with respiratory failure.  Troponin levels are now positive.  She is intubated and is on the vent.  ECG from this afternoon reveals poor R wave progression - ? Ant. MI.  And TWI inferiorly.    Echo this afternoon shows no segmental WMA with EF 55%.  Exam:  Lungs : on vent  Heart:  RR 2/6 systolic murmur to axilla  Ext:  Trace - 1+ edema  Neuro: sedated  This may represent an acute MI but we also need to consider Troponin elevation from CHF or Pulmonary embolus.  Will check ECG.  D-dimer.   Our team will see her in the AM.    Alvia Grove., MD, [facility] 12/03/2012, 7:53 PM Office - [phone] Pager [phone]

## 2012-12-03 NOTE — Progress Notes (Signed)
ANTIBIOTIC CONSULT NOTE - INITIAL  Pharmacy Consult for vancomycin and zosyn Indication: rule out pneumonia  Allergies  Allergen Reactions  . Fentanyl Itching    Patient Measurements: Height: 5\' 2"  (157.5 cm) Weight: 152 lb 1.9 oz (69 kg) IBW/kg (Calculated) : 50.1 Adjusted Body Weight:   Vital Signs: Temp: 98.4 F (36.9 C) (05/12 0300) Temp src: Other (Comment) (05/12 0042) BP: 103/54 mmHg (05/12 0300) Pulse Rate: 72 (05/12 0300) Intake/Output from previous day:   Intake/Output from this shift:    Labs:  Recent Labs  12/02/12 2349  WBC 12.4*  HGB 9.9*  PLT 330  CREATININE 0.94   Estimated Creatinine Clearance: 46.4 ml/min (by C-G formula based on Cr of 0.94). No results found for this basename: VANCOTROUGH, VANCOPEAK, VANCORANDOM, GENTTROUGH, GENTPEAK, GENTRANDOM, TOBRATROUGH, TOBRAPEAK, TOBRARND, AMIKACINPEAK, AMIKACINTROU, AMIKACIN,  in the last 72 hours   Microbiology: No results found for this or any previous visit (from the past 720 hour(s)).  Medical History: Past Medical History  Diagnosis Date  . Pancreatic adenoma   . Pancreatic cancer 12/03/2012  . Diabetes mellitus 12/03/2012  . Hypothyroidism 12/03/2012  . Clostridium difficile enterocolitis   . Depression     Medications:  Prescriptions prior to admission  Medication Sig Dispense Refill  . aliskiren (TEKTURNA) 300 MG tablet Take 300 mg by mouth daily.      . Alum & Mag Hydroxide-Simeth (MAGIC MOUTHWASH) SOLN Take 15 mLs by mouth 4 (four) times daily.      . ARIPiprazole (ABILIFY) 5 MG tablet Take 5 mg by mouth daily.      . ciprofloxacin (CIPRO) 500 MG tablet Take 500 mg by mouth every 12 (twelve) hours. For 13 days      . clonazePAM (KLONOPIN) 0.5 MG tablet Take 0.5 mg by mouth 2 (two) times daily as needed for anxiety.      . diphenoxylate-atropine (LOMOTIL) 2.5-0.025 MG per tablet Take 1 tablet by mouth 4 (four) times daily as needed for diarrhea or loose stools.      Marland Kitchen escitalopram  (LEXAPRO) 10 MG tablet Take 10 mg by mouth daily.      . furosemide (LASIX) 20 MG tablet Take 20 mg by mouth daily.      Marland Kitchen levothyroxine (SYNTHROID, LEVOTHROID) 112 MCG tablet Take 112 mcg by mouth daily before breakfast.      . lidocaine-prilocaine (EMLA) cream Apply 1 application topically as needed (Apply 30 gm 45 minutes before port).      . lipase/protease/amylase (CREON-12/PANCREASE) 12000 UNITS CPEP Take 1 capsule by mouth 3 (three) times daily with meals.      . Multiple Vitamin (MULTIVITAMIN WITH MINERALS) TABS Take 1 tablet by mouth daily.      . nebivolol (BYSTOLIC) 5 MG tablet Take 5 mg by mouth daily.      . ondansetron (ZOFRAN) 8 MG tablet Take by mouth every 8 (eight) hours as needed for nausea.      . solifenacin (VESICARE) 10 MG tablet Take 10 mg by mouth daily.      . traMADol (ULTRAM) 50 MG tablet Take 50 mg by mouth every 6 (six) hours as needed for pain.      . Vancomycin HCl 25 MG/ML SOLN Take 125 mg by mouth every 6 (six) hours. For 21 days      . zolpidem (AMBIEN CR) 6.25 MG CR tablet Take 6.25 mg by mouth at bedtime as needed for sleep.       Assessment: 78 yo female with pancreatic cancer  with sudden onset sob and hypoxemia requiring intubation. Chest xray reveals bilateral infiltrates. Vanc and zoysn for wide spec coverage vanc 1gm give at 0100 zosyn at 0230   Goal of Therapy:  Vancomycin trough level 15-20 mcg/ml  Plan: vancomycin 750 mg q12 next dose at noon   Zosyn 3.375 gm q8h (4 hour infusion)    Janice Coffin 12/03/2012,4:22 AM

## 2012-12-03 NOTE — Care Management Note (Addendum)
    Page 1 of 2   12/07/2012     10:38:50 AM   CARE MANAGEMENT NOTE 12/07/2012  Patient:  Tonya Wallace, Tonya Wallace   Account Number:  [id number]  Date Initiated:  12/03/2012  Documentation initiated by:  Junius Creamer  Subjective/Objective Assessment:   adm w resp failure, on vent     Action/Plan:   lives w husband   Anticipated DC Date:  12/07/2012   Anticipated DC Plan:  HOME W HOME HEALTH SERVICES      DC Planning Services  CM consult      [facility] Choice  HOME HEALTH  Resumption Of Svcs/PTA Provider   Choice offered to / List presented to:  C-1 Patient        HH arranged  HH-1 RN  HH-10 DISEASE MANAGEMENT  HH-2 PT      HH agency  [facility]   Status of service:  Completed, signed off Medicare Important Message given?   (If response is "NO", the following Medicare IM given date fields will be blank) Date Medicare IM given:   Date Additional Medicare IM given:    Discharge Disposition:  HOME W HOME HEALTH SERVICES  Per UR Regulation:  Reviewed for med. necessity/level of care/duration of stay  If discussed at Long Length of Stay Meetings, dates discussed:    Comments:  12/07/12 [name] [phone] PT FOR DC HOME TODAY.  PT ACTIVE WITH [facility] PTA FOR HHRN, PER PT.  WILL NEED HHPT ADDED, AS ORDERED BY MD.  NOTIFIED [facility] OF DC TODAY AND NEW ORDERS.  START OF CARE 24-48H POST DC DATE.

## 2012-12-03 NOTE — Progress Notes (Signed)
Echocardiogram 2D Echocardiogram has been performed.  Star Cheese 12/03/2012, 9:39 AM

## 2012-12-03 NOTE — ED Notes (Signed)
Admitting doctor here to see.  Husbands number given to the doctor

## 2012-12-03 NOTE — ED Notes (Signed)
The pt s eyes open to there name being called.  No admitting doctor has seen yet.  Her bp is steadily decreasing

## 2012-12-03 NOTE — ED Notes (Signed)
pts husband s phone number [phone]

## 2012-12-03 NOTE — Progress Notes (Signed)
Paged Cardiology per CCM request to report elevated troponins.

## 2012-12-03 NOTE — Consult Note (Signed)
CARDIOLOGY CONSULT NOTE  Patient ID: GIZEL RIEDLINGER MRN: [id number], DOB/AGE: [birth year]   Admit date: 12/02/2012 Date of Consult: 12/03/2012  Primary Physician: Provider Not In System Primary Cardiologist: New  Pt. Profile  78 y/o female with h/o cardiomyopathy with an EF of 40-45% by echo @ [facility] last month, whom we've been asked to eval 2/2 resp failure and chf.  Problem List  Past Medical History  Diagnosis Date  . Pancreatic adenoma   . Pancreatic cancer 12/03/2012  . Diabetes mellitus 12/03/2012  . Hypothyroidism 12/03/2012  . Clostridium difficile enterocolitis   . Depression   . Cardiomyopathy     a. 10/2012 Echo: EF 40-45%, basal inf, inflat, infsept HK, aortic sclerosis, mildly dil LA, mod MR, mild TR, mod PAH;  b. 11/2012 Echo: EF 50-55%, no rwma, gr 3 DD, Ao sclerosis, Mild MVP w mild to mod MR, mildly dil LA.  Marland Kitchen Hypertension   . Anxiety   . Iron deficiency anemia     a. h/o transfusion, most recently 10/2012 - [facility]    Past Surgical History  Procedure Laterality Date  . Gastric bypass    . Abdominal hysterectomy    . Back surgery    . Cervical fusion    . Right total knee replacement    . Cholecystectomy    . Appendectomy    . Bilateral cataract surgeries      Allergies  Allergies  Allergen Reactions  . Fentanyl Itching   HPI   78 y/o female with the above complex problem list.  She has been treated with chemo @ [facility] for pancreatic CA recently.  She was also admitted to [facility] in April for C diff and resp failure.  During that admission she underwent an echo that showed an EF of 40-45% with wall motion abnormalities as outlined above.  I was not able to find a cardiology consult note or other testing that may have resulted from finding reduced LV fxn in care everywhere.  Per Onc note on 5/1, she was doing reasonably well, though she continued to suffer from anemia and required an outpt transfusion of 2 units prbc's on 5/9.  At that visit, she weighed 145  lbs.  Pt presented to the ED last night with acute resp failure requiring intubation.  CXR suggested bilat pna and she has been treated with abx.  ProBNP was >26K and she has also been treated for acute on chronic chf.  Echo was repeated earlier today, and shows EF of 50-55% without wall motion abnormalities.  We've been asked to eval.  She remains intubated and is not able to provide history at this time.  Inpatient Medications  . ARIPiprazole  5 mg Oral Daily  . Chlorhexidine Gluconate Cloth  6 each Topical Q0600  . famotidine  20 mg Oral BID  . furosemide  40 mg Intravenous Q12H  . heparin  5,000 Units Subcutaneous Q8H  . insulin aspart  2-6 Units Subcutaneous Q4H  . levothyroxine  112 mcg Per NG tube QAC breakfast  . mupirocin ointment  1 application Nasal BID  . piperacillin-tazobactam (ZOSYN)  IV  3.375 g Intravenous Q8H  . vancomycin  125 mg Per NG tube Q6H  . vancomycin  500 mg Intravenous Q12H   Family History- unable to obtain fully 2/2 intubated status. No family history on file.   Social History - unable to obtain fully 2/2 intubated status. History   Social History  . Marital Status: Married    Spouse  Name: N/A    Number of Children: N/A  . Years of Education: N/A   Occupational History  . Not on file.   Social History Main Topics  . Smoking status: Never Smoker   . Smokeless tobacco: Not on file  . Alcohol Use: No  . Drug Use: Not on file  . Sexually Active: Not on file   Other Topics Concern  . Not on file   Social History Narrative   Lives in [place] with her husband.     Review of Systems - unable to obtain 2/2 intubated status.  General:  No chills, fever, night sweats or weight changes.  Cardiovascular:  No chest pain, dyspnea on exertion, edema, orthopnea, palpitations, paroxysmal nocturnal dyspnea. Dermatological: No rash, lesions/masses Respiratory: No cough, dyspnea Urologic: No hematuria, dysuria Abdominal:   No nausea, vomiting,  diarrhea, bright red blood per rectum, melena, or hematemesis Neurologic:  No visual changes, wkns, changes in mental status. All other systems reviewed and are otherwise negative except as noted above.  Physical Exam  Blood pressure 107/53, pulse 64, temperature 99.6 F (37.6 C), temperature source Core (Comment), resp. rate 24, height 5\' 2"  (1.575 m), weight 151 lb 7.3 oz (68.7 kg), SpO2 98.00%.  General: intubated.  Opens eyes to voice and acknowledges my presence. Psych: flat affect. Neuro: intubated.  groggy. Moves all extremities spontaneously and able to follow commands. HEENT: Normal  Neck: Supple without bruits.  JVD to jaw. Lungs:  Resp regular and unlabored, rhonchi throughout. Heart: RRR, distant, no s3, s4, or murmurs. Abdomen: Soft, non-tender, non-distended, BS + x 4.  Extremities: No clubbing, cyanosis.  1+ bilat pedal edema. DP/PT/Radials 2+ and equal bilaterally.  Labs   Recent Labs  12/02/12 2349  TROPONINI <0.30   Lab Results  Component Value Date   WBC 5.3 12/03/2012   HGB 7.9* 12/03/2012   HCT 24.4* 12/03/2012   MCV 80.5 12/03/2012   PLT 202 12/03/2012     Recent Labs Lab 12/03/12 0936  NA 138  K 3.7  CL 105  CO2 28  BUN 12  CREATININE 1.00  CALCIUM 7.6*  PROT 4.8*  BILITOT 0.4  ALKPHOS 86  ALT 25  AST 51*  GLUCOSE 90   Radiology/Studies  Dg Chest Port 1 View  12/02/2012  *RADIOLOGY REPORT*  Clinical Data: Shortness of breath, intubated.  PORTABLE CHEST - 1 VIEW  Comparison: None  Findings: Endotracheal tube is 3.6 cm above the carina.  Patchy bilateral airspace opacities, most confluent throughout the right lung.  Findings concerning for pneumonia.  Heart is upper limits normal in size.  No visible effusions.  Right Port-A-Cath is in place with the tip at the cavoatrial junction.  No acute bony abnormality.  IMPRESSION: Endotracheal tube 3.6 cm above the carina.  Bilateral airspace opacities, right greater than left concerning for pneumonia.    Original Report Authenticated By: Charlett Nose, M.D.    ECG  Tach, 137, lat/ant infarcts  ASSESSMENT AND PLAN  1.  Acute Respiratory Failure/Bilat PNA:  abx and vent mgmt per IM.  2.  Chronic combined systolic and diastolic CHF:  Echo performed @ [facility] last month showed EF 40-45% with pulm HTN.  Repeat today shows EF of 50-55% w/o wall motion abnormalities.  On her last office visit @ [facility], she weighed 145 lbs.  She is currently 151.  Admission pBNP 26,162.  Troponin nl to date.  Neck veins are elevated though with pulm HTN, it's hard to know how reliable  an indicator of volume status they are.  Cont IV diuresis.  Though she was tachycardic and hypertensive on arrival to the ED (in the setting of resp failure), her HR/BP are now well-controlled. We will have to look into what, if any, cardiac eval she has undergone in the past.  Plan to resume bb/tekturna once stable.  Cont to trend troponin.  3.  HTN:  As above.  4.  Iron deficiency anemia:  Per IM.  Reviw of care everywhere shows that she has received outpt transfusions - last 5/9.  5.  C diff:  Vanc per IM.  6.  Pancreatic CA:  Followed as outpt @ [facility].  It is our understanding that her cancer is under reasonable control at this time.  Signed, Nicolasa Ducking, NP 12/03/2012, 3:43 PM Patient seen and examined. I agree with the assessment and plan as detailed above. See also my additional thoughts below.  I reviewed the case completely with Mr. Brion Aliment. I agree with the note above with my modifications. The patient presents with respiratory failure. It is possible that there may be both a cardiac and pulmonary component to it. Historically her ejection fraction was in the 40-45% range. At the time of that prior echo done elsewhere there was inferior and inferolateral wall motion abnormalities. The current echo this admission is reddish showing an ejection fraction 50-55% and no definite focal wall motion abnormalities. By history we do  not know if she has any coronary artery disease. The patient is anemic and has a low albumin. She definitely has edema. There is a component of volume overload that is probably multifactorial.   The plan for now will be to proceed with appropriate diuresis and treatment of other potential lung issues watching her renal function. Also, her anemia will need to be stabilized. It also seems that nutrition will be important to help with her albumin. As she stabilizes we will make further decisions about the approach to her cardiac care. Troponins have been normal to this point. This would seem to argue against an acute ischemic event leading to acute heart failure.  Willa Rough, MD, [facility] 12/03/2012 4:08 PM

## 2012-12-03 NOTE — ED Provider Notes (Signed)
History     CSN: [id number]  Arrival date & time 12/02/12  2306   First MD Initiated Contact with Patient 12/02/12 2326      Chief Complaint  Patient presents with  . Shortness of Breath    (Consider location/radiation/quality/duration/timing/severity/associated sxs/prior treatment) Patient is a 78 y.o. female presenting with shortness of breath.  Shortness of Breath  Level 5 caveat due to urgent need for intervention.  Pt brought to the ED via EMS from home, initially as a Code STEMI, but met in the ED by Cardiology who cancelled STEMI. She arrived on CPAP with SpO2 in the 60-70s, not improved with BiPAP and so emergently intubated as below.   Past Medical History  Diagnosis Date  . Pancreatic adenoma     No past surgical history on file.  No family history on file.  History  Substance Use Topics  . Smoking status: Never Smoker   . Smokeless tobacco: Not on file  . Alcohol Use: No    OB History   Grav Para Term Preterm Abortions TAB SAB Ect Mult Living                  Review of Systems  Respiratory: Positive for shortness of breath.    Unable to assess due to mental status.   Allergies  Fentanyl  Home Medications   Current Outpatient Rx  Name  Route  Sig  Dispense  Refill  . aliskiren (TEKTURNA) 300 MG tablet   Oral   Take 300 mg by mouth daily.         . Alum & Mag Hydroxide-Simeth (MAGIC MOUTHWASH) SOLN   Oral   Take 15 mLs by mouth 4 (four) times daily.         . ARIPiprazole (ABILIFY) 5 MG tablet   Oral   Take 5 mg by mouth daily.         . ciprofloxacin (CIPRO) 500 MG tablet   Oral   Take 500 mg by mouth every 12 (twelve) hours. For 13 days         . clonazePAM (KLONOPIN) 0.5 MG tablet   Oral   Take 0.5 mg by mouth 2 (two) times daily as needed for anxiety.         . diphenoxylate-atropine (LOMOTIL) 2.5-0.025 MG per tablet   Oral   Take 1 tablet by mouth 4 (four) times daily as needed for diarrhea or loose stools.          Marland Kitchen escitalopram (LEXAPRO) 10 MG tablet   Oral   Take 10 mg by mouth daily.         . furosemide (LASIX) 20 MG tablet   Oral   Take 20 mg by mouth daily.         Marland Kitchen levothyroxine (SYNTHROID, LEVOTHROID) 112 MCG tablet   Oral   Take 112 mcg by mouth daily before breakfast.         . lidocaine-prilocaine (EMLA) cream   Topical   Apply 1 application topically as needed (Apply 30 gm 45 minutes before port).         . lipase/protease/amylase (CREON-12/PANCREASE) 12000 UNITS CPEP   Oral   Take 1 capsule by mouth 3 (three) times daily with meals.         . Multiple Vitamin (MULTIVITAMIN WITH MINERALS) TABS   Oral   Take 1 tablet by mouth daily.         . nebivolol (BYSTOLIC) 5 MG tablet  Oral   Take 5 mg by mouth daily.         . ondansetron (ZOFRAN) 8 MG tablet   Oral   Take by mouth every 8 (eight) hours as needed for nausea.         . solifenacin (VESICARE) 10 MG tablet   Oral   Take 10 mg by mouth daily.         . traMADol (ULTRAM) 50 MG tablet   Oral   Take 50 mg by mouth every 6 (six) hours as needed for pain.         . Vancomycin HCl 25 MG/ML SOLN   Oral   Take 125 mg by mouth every 6 (six) hours. For 21 days         . zolpidem (AMBIEN CR) 6.25 MG CR tablet   Oral   Take 6.25 mg by mouth at bedtime as needed for sleep.           BP 129/65  Pulse 86  Temp(Src) 98.1 F (36.7 C) (Other (Comment))  Resp 28  SpO2 94%  Physical Exam  Nursing note and vitals reviewed. Constitutional: She appears well-developed and well-nourished.  HENT:  Head: Normocephalic and atraumatic.  Eyes: Pupils are equal, round, and reactive to light.  Neck: Neck supple.  Cardiovascular: Normal rate, normal heart sounds and intact distal pulses.   Pulmonary/Chest:  Rales diffusely  Abdominal: She exhibits no distension.  Musculoskeletal: She exhibits edema (marked bilateral LE edema).  Neurological:  Unable to assess due to mental status, unresponsive to  verbal or painful stimuli  Skin: Skin is warm and dry. No rash noted.    ED Course  Procedures (including critical care time) INTUBATION Performed by: Susy Frizzle B.  Required items: required blood products, implants, devices, and special equipment available Patient identity confirmed: provided demographic data and hospital-assigned identification number Time out: Immediately prior to procedure a "time out" was called to verify the correct patient, procedure, equipment, support staff and site/side marked as required.  Indications: respiratory failure  Intubation method: Glidescope Laryngoscopy   Preoxygenation: BiPAP   Sedatives: Etomidate Paralytic: Succinylcholine  Tube Size: 7.5 cuffed  Post-procedure assessment: chest rise and ETCO2 monitor Breath sounds: equal and absent over the epigastrium Tube secured with: ETT holder Chest x-ray interpreted by radiologist and me.  Chest x-ray findings: endotracheal tube in appropriate position  Patient tolerated the procedure well with no immediate complications.    Labs Reviewed  CBC WITH DIFFERENTIAL - Abnormal; Notable for the following:    WBC 12.4 (*)    RBC 3.79 (*)    Hemoglobin 9.9 (*)    HCT 31.2 (*)    RDW 21.0 (*)    Neutrophils Relative 35 (*)    Lymphocytes Relative 48 (*)    Lymphs Abs 6.0 (*)    Monocytes Relative 14 (*)    Monocytes Absolute 1.8 (*)    All other components within normal limits  COMPREHENSIVE METABOLIC PANEL - Abnormal; Notable for the following:    Glucose, Bld 286 (*)    Calcium 8.3 (*)    Albumin 2.3 (*)    AST 42 (*)    Alkaline Phosphatase 118 (*)    GFR calc non Af Amer 57 (*)    GFR calc Af Amer 67 (*)    All other components within normal limits  PRO B NATRIURETIC PEPTIDE - Abnormal; Notable for the following:    Pro B Natriuretic peptide (BNP) 16109.6 (*)  All other components within normal limits  LIPASE, BLOOD - Abnormal; Notable for the following:    Lipase 8 (*)     All other components within normal limits  POCT I-STAT 3, BLOOD GAS (G3+) - Abnormal; Notable for the following:    pH, Arterial 7.295 (*)    pCO2 arterial 48.1 (*)    pO2, Arterial 78.0 (*)    Acid-base deficit 3.0 (*)    All other components within normal limits  URINE CULTURE  TROPONIN I  PROTIME-INR  APTT  URINALYSIS, ROUTINE W REFLEX MICROSCOPIC  TYPE AND SCREEN   Dg Chest Port 1 View  12/02/2012  *RADIOLOGY REPORT*  Clinical Data: Shortness of breath, intubated.  PORTABLE CHEST - 1 VIEW  Comparison: None  Findings: Endotracheal tube is 3.6 cm above the carina.  Patchy bilateral airspace opacities, most confluent throughout the right lung.  Findings concerning for pneumonia.  Heart is upper limits normal in size.  No visible effusions.  Right Port-A-Cath is in place with the tip at the cavoatrial junction.  No acute bony abnormality.  IMPRESSION: Endotracheal tube 3.6 cm above the carina.  Bilateral airspace opacities, right greater than left concerning for pneumonia.   Original Report Authenticated By: Charlett Nose, M.D.      1. Respiratory failure   2. HCAP (healthcare-associated pneumonia)   3. Pulmonary edema       MDM   Date: 12/03/2012  Rate: 137  Rhythm: sinus tachycardia  QRS Axis: right  Intervals: normal  ST/T Wave abnormalities: nonspecific T wave changes  Conduction Disutrbances:none  Narrative Interpretation:   Old EKG Reviewed: none available  CXR as above concerning for pneumonia, but no fever today. Husband now at bedside states symptoms started acutely at 9pm, no cough or SOB the last few days. She has had LE edema that has been attributed to her chemo at [facility]. Also admitted for UTI a week ago has been on broad spectrum Abx for same as well as C-diff recently. Treated for HCAP although also concerned for acute Pulm Edema given elevated BNP. Discussed with PCCM who will see the patient in the ED for admission.   CRITICAL CARE Performed by:  Pollyann Savoy Total critical care time: 45 Critical care time was exclusive of separately billable procedures and treating other patients. Critical care was necessary to treat or prevent imminent or life-threatening deterioration. Critical care was time spent personally by me on the following activities: development of treatment plan with patient and/or surrogate as well as nursing, discussions with consultants, evaluation of patient's response to treatment, examination of patient, obtaining history from patient or surrogate, ordering and performing treatments and interventions, ordering and review of laboratory studies, ordering and review of radiographic studies, pulse oximetry and re-evaluation of patient's condition.           Charles B. Bernette Mayers, MD 12/03/12 0630

## 2012-12-03 NOTE — ED Notes (Signed)
16 Fr Foley cath inserted, with assistance from Chrisandra Netters, Charity fundraiser. using sterile technique. Concentrated urine returned

## 2012-12-03 NOTE — ED Notes (Signed)
Vancomycin hung 

## 2012-12-03 NOTE — Progress Notes (Signed)
PULMONARY  / CRITICAL CARE MEDICINE  Name: Tonya Wallace MRN: [id number] DOB: 1935/04/23    ADMISSION DATE:  12/02/2012  PRIMARY SERVICE: PCCM  CHIEF COMPLAINT:  Acute respiratory failure.  BRIEF PATIENT DESCRIPTION:  78 years old female with pancreatic cancer. Presents with sudden onset of SOB. At admission found profoundly hypoxemic requiring intubation. Chest X ray with bilateral infiltrates. History obtained by r eview of [facility] (via epic) :EF 40%, biliary drain placed in jan '14, recent venous duplex neg, C diff & UTI being treated with cipro & PO vanc     LINES / TUBES: - Peripheral IV's - Right port- A-cath  CULTURES: - Blood cultures 5/11 >> - Urine cultures5/11 >>  respiratory cultures. 5/11  ANTIBIOTICS: - Zosyn 5/11 >> - Vancomycin 5/11 >> PO vanc >>  HISTORY OF PRESENT ILLNESS:   78 years old female with PMH relevant for pancreatic cancer, DM, hypothyroidism and depression. Presents with sudden onset of SOB. Famile denies any cough, sputum production or fever. At arrival to the ED profoundly hypoxemic requiring intubation. Chest X ray with bilateral infiltrates suggestive of multifocal pneumonia vs CHF. BNP [id number]. At the time of my exam the patient is intubated, sedated, hemodynamically stable. Got Lasix 80 mg IV in the ED.   SUBJECTIVE: denies pain, breathing better  VITAL SIGNS: Temp:  [98 F (36.7 C)-99.2 F (37.3 C)] 99.2 F (37.3 C) (05/12 1142) Pulse Rate:  [59-134] 80 (05/12 1142) Resp:  [0-33] 26 (05/12 1142) BP: (73-181)/(41-100) 125/62 mmHg (05/12 1142) SpO2:  [60 %-100 %] 99 % (05/12 1142) FiO2 (%):  [40 %-100 %] 40 % (05/12 1142) Weight:  [68.7 kg (151 lb 7.3 oz)-69 kg (152 lb 1.9 oz)] 68.7 kg (151 lb 7.3 oz) (05/12 0415) HEMODYNAMICS:   VENTILATOR SETTINGS: Vent Mode:  [-] CPAP FiO2 (%):  [40 %-100 %] 40 % Set Rate:  [16 bmp] 16 bmp Vt Set:  [370 mL] 370 mL PEEP:  [5 cmH20-8 cmH20] 5 cmH20 Pressure Support:  [10 cmH20] 10  cmH20 Plateau Pressure:  [19 cmH20-24 cmH20] 19 cmH20 INTAKE / OUTPUT: Intake/Output     05/11 0701 - 05/12 0700 05/12 0701 - 05/13 0700   I.V. (mL/kg) 531.3 (7.7)    IV Piggyback 25    Total Intake(mL/kg) 556.3 (8.1)    Urine (mL/kg/hr) 675    Total Output 675     Net -118.7            PHYSICAL EXAMINATION: General: No apparent distress. Intubated. Eyes: Anicteric sclerae. ENT: ETT in place. Trachea at midline. Lymph: No cervical, supraclavicular, or axillary lymphadenopathy. Heart: Normal S1, S2. No murmurs, rubs, or gallops appreciated. No bruits, equal pulses. Lungs: Bilateral diffuse crackles. No wheezing. Abdomen: Abdomen soft, non-tender and not distended, normoactive bowel sounds. Biliary drainage in place.  Musculoskeletal: No clubbing or synovitis. 3+ LE edema. Skin: No rashes or lesions Neuro: Sedated. Open eyes on voice command. Moves all 4 extremities. LABS:  Recent Labs Lab 12/02/12 2349 12/03/12 0003 12/03/12 0031 12/03/12 0309 12/03/12 0936  HGB 9.9*  --   --   --  7.9*  WBC 12.4*  --   --   --  5.3  PLT 330  --   --   --  202  NA 136  --   --   --  138  K 4.2  --   --   --  3.7  CL 100  --   --   --  105  CO2 19  --   --   --  28  GLUCOSE 286*  --   --   --  90  BUN 12  --   --   --  12  CREATININE 0.94  --   --   --  1.00  CALCIUM 8.3*  --   --   --  7.6*  AST 42*  --   --   --  51*  ALT 30  --   --   --  25  ALKPHOS 118*  --   --   --  86  BILITOT 0.4  --   --   --  0.4  PROT 6.1  --   --   --  4.8*  ALBUMIN 2.3*  --   --   --  1.8*  APTT 32  --   --   --   --   INR 1.19  --   --   --   --   LATICACIDVEN  --  6.22*  --  1.9  --   TROPONINI <0.30  --   --   --   --   PROCALCITON  --   --   --  0.93  --   PROBNP 26162.0*  --   --   --   --   PHART  --   --  7.295*  --   --   PCO2ART  --   --  48.1*  --   --   PO2ART  --   --  78.0*  --   --     Recent Labs Lab 12/03/12 0424 12/03/12 0721 12/03/12 1150  GLUCAP 195* 103* 73    CXR:   Bilateral patchy infiltrates worse on the right. Likely pneumonia but cannot rule out CHF.   ASSESSMENT / PLAN:  PULMONARY A: 1) Acute hypoxemic respiratory failure / ARDS 2) HCAP but unable to rule out pulmonary edema given markedly high BNP. Bedside US with depressed LVEF probably about  40% P:   Drop PEEP to 5 & start wean - Zosyn - Vancomycin - Tracheal aspirate cultures - Albuterol PRN  CARDIOVASCULAR A:  1) Acute decompensated heart failure.  2) Hypertensive with SBP in the 180's at admission. P:  - Lasix 40 mg IV BID if BP tolerates - Echocardiogram in am _rapid improvement more s/o CHF than pna although CXR atypical Follow BNP  RENAL A:   1) No issues P:   - follow electrolytes on lasix  GASTROINTESTINAL A:   1) Pancreatic cancer 2) History of C. Diff on PO vancomycin P:   - GI prophylaxis with protonix IV - We will continue vancomycin per NGT -Biliary drain to stay capped  HEMATOLOGIC A:   1) No issues P:  - DVT prophylaxis with SQ heparin and SCD's - Will follow CBC  INFECTIOUS A:   1) HCAP P:   - Antibiotics as above - We will follow cultures  ENDOCRINE A:   1) DM 2) Hypothyroidism P:   - ICU hyperglycemia protocol with SQ insulin SS  NEUROLOGIC A:   1) No issues P:   - Intermittent sedation with Versed and Fentanyl   I have personally obtained a history, examined the patient, evaluated laboratory and imaging results, formulated the assessment and plan and placed orders. CRITICAL CARE: Critical Care Time devoted to patient care services described in this note is 35 minutes.   Cyril Mourning MD. Tonny Bollman. [facility]  Pager [phone] If no response call [phone]    12/03/2012, 1:32 PM

## 2012-12-04 ENCOUNTER — Inpatient Hospital Stay (HOSPITAL_COMMUNITY): Payer: Medicare Other

## 2012-12-04 DIAGNOSIS — I214 Non-ST elevation (NSTEMI) myocardial infarction: Secondary | ICD-10-CM

## 2012-12-04 LAB — RENAL FUNCTION PANEL
Albumin: 1.7 g/dL — ABNORMAL LOW (ref 3.5–5.2)
CO2: 29 mEq/L (ref 19–32)
Calcium: 7.6 mg/dL — ABNORMAL LOW (ref 8.4–10.5)
Creatinine, Ser: 1.01 mg/dL (ref 0.50–1.10)
GFR calc non Af Amer: 53 mL/min — ABNORMAL LOW (ref >90)

## 2012-12-04 LAB — POCT I-STAT 3, ART BLOOD GAS (G3+)
Bicarbonate: 26.8 mEq/L — ABNORMAL HIGH (ref 20.0–24.0)
Patient temperature: 98.6
pH, Arterial: 7.415 (ref 7.350–7.450)
pO2, Arterial: 109 mmHg — ABNORMAL HIGH (ref 80.0–100.0)

## 2012-12-04 LAB — BASIC METABOLIC PANEL
BUN: 12 mg/dL (ref 6–23)
Calcium: 8 mg/dL — ABNORMAL LOW (ref 8.4–10.5)
Creatinine, Ser: 1.06 mg/dL (ref 0.50–1.10)
GFR calc Af Amer: 58 mL/min — ABNORMAL LOW (ref >90)
GFR calc non Af Amer: 50 mL/min — ABNORMAL LOW (ref >90)
Potassium: 3.5 mEq/L (ref 3.5–5.1)

## 2012-12-04 LAB — MAGNESIUM: Magnesium: 1.2 mg/dL — ABNORMAL LOW (ref 1.5–2.5)

## 2012-12-04 LAB — GLUCOSE, CAPILLARY
Glucose-Capillary: 81 mg/dL (ref 70–99)
Glucose-Capillary: 86 mg/dL (ref 70–99)

## 2012-12-04 LAB — CBC
MCH: 26.4 pg (ref 26.0–34.0)
MCHC: 32.5 g/dL (ref 30.0–36.0)
Platelets: 216 10*3/uL (ref 150–400)

## 2012-12-04 LAB — PRO B NATRIURETIC PEPTIDE: Pro B Natriuretic peptide (BNP): 20537 pg/mL — ABNORMAL HIGH (ref 0–450)

## 2012-12-04 MED ORDER — FAMOTIDINE 20 MG PO TABS
20.0000 mg | ORAL_TABLET | Freq: Two times a day (BID) | ORAL | Status: DC
  Administered 2012-12-04 – 2012-12-07 (×6): 20 mg via ORAL
  Filled 2012-12-04 (×7): qty 1

## 2012-12-04 MED ORDER — ASPIRIN 81 MG PO CHEW
81.0000 mg | CHEWABLE_TABLET | Freq: Every day | ORAL | Status: DC
  Administered 2012-12-04 – 2012-12-07 (×4): 81 mg via ORAL
  Filled 2012-12-04 (×4): qty 1

## 2012-12-04 MED ORDER — POTASSIUM CHLORIDE 20 MEQ/15ML (10%) PO LIQD
40.0000 meq | Freq: Every day | ORAL | Status: DC
Start: 2012-12-04 — End: 2012-12-04
  Administered 2012-12-04: 40 meq
  Filled 2012-12-04: qty 30

## 2012-12-04 MED ORDER — POTASSIUM CHLORIDE 10 MEQ/50ML IV SOLN
10.0000 meq | INTRAVENOUS | Status: AC
  Administered 2012-12-04 (×5): 10 meq via INTRAVENOUS
  Filled 2012-12-04: qty 150
  Filled 2012-12-04: qty 100

## 2012-12-04 MED ORDER — POTASSIUM CHLORIDE 20 MEQ/15ML (10%) PO LIQD
ORAL | Status: AC
  Filled 2012-12-04: qty 30

## 2012-12-04 MED ORDER — POTASSIUM CHLORIDE CRYS ER 20 MEQ PO TBCR
40.0000 meq | EXTENDED_RELEASE_TABLET | Freq: Every day | ORAL | Status: DC
  Administered 2012-12-06 – 2012-12-07 (×2): 40 meq via ORAL
  Filled 2012-12-04 (×5): qty 2

## 2012-12-04 MED ORDER — NEBIVOLOL HCL 5 MG PO TABS
5.0000 mg | ORAL_TABLET | Freq: Every day | ORAL | Status: DC
  Administered 2012-12-04 – 2012-12-07 (×4): 5 mg via ORAL
  Filled 2012-12-04 (×4): qty 1

## 2012-12-04 NOTE — Procedures (Signed)
Extubation Procedure Note  Patient Details:   Name: Tonya Wallace DOB: 09-Jun-1935 MRN: [id number]   Airway Documentation:     Evaluation  O2 sats: stable throughout Complications: No apparent complications Patient did tolerate procedure well. Bilateral Breath Sounds: Rhonchi Suctioning: Airway Yes  Ok Anis, MA 12/04/2012, 9:56 AM

## 2012-12-04 NOTE — Progress Notes (Signed)
Hypokalemia   K replaced  

## 2012-12-04 NOTE — Progress Notes (Signed)
Subjective:  This am the patient has been extubated earlier this am. She denies any chest pain. No respiratory distress.  Objective:  Vital Signs in the last 24 hours: Temp:  [98.5 F (36.9 C)-100.5 F (38.1 C)] 100.5 F (38.1 C) (05/13 0800) Pulse Rate:  [56-89] 80 (05/13 0800) Resp:  [0-30] 30 (05/13 0800) BP: (86-159)/(45-67) 159/65 mmHg (05/13 0800) SpO2:  [96 %-100 %] 99 % (05/13 0800) FiO2 (%):  [40 %-40.5 %] 40.1 % (05/13 0936) Weight:  [152 lb 8.9 oz (69.2 kg)] 152 lb 8.9 oz (69.2 kg) (05/13 0432)  Intake/Output from previous day: 05/12 0701 - 05/13 0700 In: 1085 [I.V.:630; IV Piggyback:455] Out: 2700 [Urine:2700] Intake/Output from this shift: Total I/O In: 100 [IV Piggyback:100] Out: 125 [Urine:125]  . ARIPiprazole  5 mg Oral Daily  . Chlorhexidine Gluconate Cloth  6 each Topical Q0600  . famotidine  20 mg Oral BID  . furosemide  40 mg Intravenous Q12H  . heparin  5,000 Units Subcutaneous Q8H  . insulin aspart  2-6 Units Subcutaneous Q4H  . levothyroxine  112 mcg Per NG tube QAC breakfast  . mupirocin ointment  1 application Nasal BID  . nebivolol  5 mg Oral Daily  . piperacillin-tazobactam (ZOSYN)  IV  3.375 g Intravenous Q8H  . potassium chloride  10 mEq Intravenous Q1 Hr x 5  . potassium chloride  40 mEq Per Tube Daily  . vancomycin  125 mg Per NG tube Q6H  . vancomycin  500 mg Intravenous Q12H      Physical Exam: The patient appears to be in no distress.  Head and neck exam reveals that the pupils are equal and reactive.  The extraocular movements are full.  There is no scleral icterus.  Mouth and pharynx are benign.  No lymphadenopathy.  No carotid bruits.  The jugular venous pressure is normal.  Thyroid is not enlarged or tender.  Chest reveals inspiratory rales at right base.  Heart reveals no abnormal lift or heave.  First and second heart sounds are normal.  There is no murmur gallop rub or click.  The abdomen is soft and nontender.  Bowel  sounds are normoactive.  There is no hepatosplenomegaly or mass.  There are no abdominal bruits.  Extremities reveal chronic bilateral edema Neurologic exam is normal strength and no lateralizing weakness.  No sensory deficits.  Integument reveals no rash  Lab Results:  Recent Labs  12/03/12 0936 12/04/12 0400  WBC 5.3 3.7*  HGB 7.9* 7.6*  PLT 202 216    Recent Labs  12/03/12 0936 12/04/12 0400  NA 138 141  K 3.7 2.9*  CL 105 103  CO2 28 29  GLUCOSE 90 80  BUN 12 12  CREATININE 1.00 1.01    Recent Labs  12/02/12 2349 12/03/12 1600  TROPONINI <0.30 7.08*   Hepatic Function Panel  Recent Labs  12/03/12 0936 12/04/12 0400  PROT 4.8*  --   ALBUMIN 1.8* 1.7*  AST 51*  --   ALT 25  --   ALKPHOS 86  --   BILITOT 0.4  --    No results found for this basename: CHOL,  in the last 72 hours No results found for this basename: PROTIME,  in the last 72 hours  Imaging: Dg Chest Port 1 View  12/04/2012  *RADIOLOGY REPORT*  Clinical Data: Shortness of breath.  Edema.  PORTABLE CHEST - 1 VIEW  Comparison: Chest 12/02/2012.  Findings: Support tubes and lines are unchanged.  Aeration in the right upper lung zone has markedly improved.  There is some increased airspace opacity in the right lung base likely due to a combination of effusion and airspace disease.  Left basilar opacity is not markedly changed.  No pneumothorax is identified.  Remote left rib fractures are identified.  IMPRESSION: Marked improvement in aeration in the right upper lung zone. Worsened aeration in the right lung base is likely due to a combination of increased pleural fluid and airspace disease.   Original Report Authenticated By: Holley Dexter, M.D.    Dg Chest Port 1 View  12/02/2012  *RADIOLOGY REPORT*  Clinical Data: Shortness of breath, intubated.  PORTABLE CHEST - 1 VIEW  Comparison: None  Findings: Endotracheal tube is 3.6 cm above the carina.  Patchy bilateral airspace opacities, most  confluent throughout the right lung.  Findings concerning for pneumonia.  Heart is upper limits normal in size.  No visible effusions.  Right Port-A-Cath is in place with the tip at the cavoatrial junction.  No acute bony abnormality.  IMPRESSION: Endotracheal tube 3.6 cm above the carina.  Bilateral airspace opacities, right greater than left concerning for pneumonia.   Original Report Authenticated By: Charlett Nose, M.D.     Cardiac Studies: Telemetry shows NSR EKG shows widespread anterior T wave inversion consistent with NSTEMI Assessment/Plan:  1. Acute Respiratory Failure: Significantly improved since yesterday.  2. Chronic combined systolic and diastolic CHF: Echo performed @ [facility] last month showed EF 40-45% with pulm HTN. Repeat today shows EF of 50-55% w/o wall motion abnormalities.  3. HTN: As above.  4. Iron deficiency anemia: Per IM. Reviw of care everywhere shows that she has received outpt transfusions - last 5/9.  5. C diff: Vanc per IM.  6. Pancreatic CA: Followed as outpt @ [facility]. It is our understanding that her cancer is under reasonable control at this time. 7. NSTEMI secondary to demand ischemia from CHF, pneumonia and severe anemia, with EKG changes and elevated troponin  Plan: Continue to trend troponins. Add ASA. Continue BB.  LOS: 2 days    Cassell Clement 12/04/2012, 9:42 AM

## 2012-12-04 NOTE — Progress Notes (Signed)
PULMONARY  / CRITICAL CARE MEDICINE  Name: Tonya Wallace MRN: [id number] DOB: 10-04-1934    ADMISSION DATE:  12/02/2012  PRIMARY SERVICE: PCCM  CHIEF COMPLAINT:  Acute respiratory failure.  BRIEF PATIENT DESCRIPTION:  78 years old female with pancreatic cancer. Presents with sudden onset of SOB. At admission found profoundly hypoxemic requiring intubation. Chest X ray with bilateral infiltrates. History obtained by r [facility] (via epic) :EF 40%, biliary drain placed in jan '14, recent venous duplex neg, C diff & UTI being treated with cipro & PO vanc   LINES / TUBES: - Peripheral IV's - Right port- A-cath  CULTURES: - Blood cultures 5/11 >>ng - Urine cultures5/11 >>  respiratory cultures. 5/11 >> C diff pcr neg  ANTIBIOTICS: - Zosyn 5/11 >> - Vancomycin 5/11 >> PO vanc >>  HISTORY OF PRESENT ILLNESS:   78 years old female with PMH relevant for pancreatic cancer, DM, hypothyroidism and depression. Presents with sudden onset of SOB. Famile denies any cough, sputum production or fever. At arrival to the ED profoundly hypoxemic requiring intubation. Chest X ray with bilateral infiltrates suggestive of multifocal pneumonia vs CHF. BNP [id number]. At the time of my exam the patient is intubated, sedated, hemodynamically stable. Got Lasix 80 mg IV in the ED.   SUBJECTIVE: denies pain, breathing better, weaning well, low gr fever  VITAL SIGNS: Temp:  [98.5 F (36.9 C)-100.5 F (38.1 C)] 100.5 F (38.1 C) (05/13 0800) Pulse Rate:  [56-89] 80 (05/13 0800) Resp:  [0-30] 30 (05/13 0800) BP: (86-159)/(45-67) 159/65 mmHg (05/13 0800) SpO2:  [96 %-100 %] 99 % (05/13 0800) FiO2 (%):  [40 %-40.5 %] 40 % (05/13 0800) Weight:  [69.2 kg (152 lb 8.9 oz)] 69.2 kg (152 lb 8.9 oz) (05/13 0432) HEMODYNAMICS:   VENTILATOR SETTINGS: Vent Mode:  [-] CPAP FiO2 (%):  [40 %-40.5 %] 40 % Set Rate:  [16 bmp] 16 bmp Vt Set:  [370 mL] 370 mL PEEP:  [5 cmH20] 5 cmH20 Pressure Support:   [5 cmH20-10 cmH20] 5 cmH20 Plateau Pressure:  [15 cmH20-19 cmH20] 18 cmH20 INTAKE / OUTPUT: Intake/Output     05/12 0701 - 05/13 0700 05/13 0701 - 05/14 0700   I.V. (mL/kg) 630 (9.1)    IV Piggyback 455 100   Total Intake(mL/kg) 1085 (15.7) 100 (1.4)   Urine (mL/kg/hr) 2700 (1.6) 125 (0.9)   Total Output 2700 125   Net -1615 -25        Stool Occurrence 5 x      PHYSICAL EXAMINATION: General: No apparent distress. Intubated. Eyes: Anicteric sclerae. ENT: ETT in place. Trachea at midline. Lymph: No cervical, supraclavicular, or axillary lymphadenopathy. Heart: Normal S1, S2. No murmurs, rubs, or gallops appreciated. No bruits, equal pulses. Lungs: Bilateral diffuse crackles. No wheezing. Abdomen: Abdomen soft, non-tender and not distended, normoactive bowel sounds. Biliary drainage in place.  Musculoskeletal: No clubbing or synovitis. 3+ LE edema. Skin: No rashes or lesions Neuro:  Open eyes on voice command. Moves all 4 extremities. LABS:  Recent Labs Lab 12/02/12 2349 12/03/12 0003 12/03/12 0031 12/03/12 0309 12/03/12 0936 12/03/12 1600 12/04/12 0400  HGB 9.9*  --   --   --  7.9*  --  7.6*  WBC 12.4*  --   --   --  5.3  --  3.7*  PLT 330  --   --   --  202  --  216  NA 136  --   --   --  138  --  141  K 4.2  --   --   --  3.7  --  2.9*  CL 100  --   --   --  105  --  103  CO2 19  --   --   --  28  --  29  GLUCOSE 286*  --   --   --  90  --  80  BUN 12  --   --   --  12  --  12  CREATININE 0.94  --   --   --  1.00  --  1.01  CALCIUM 8.3*  --   --   --  7.6*  --  7.6*  PHOS  --   --   --   --   --   --  3.9  AST 42*  --   --   --  51*  --   --   ALT 30  --   --   --  25  --   --   ALKPHOS 118*  --   --   --  86  --   --   BILITOT 0.4  --   --   --  0.4  --   --   PROT 6.1  --   --   --  4.8*  --   --   ALBUMIN 2.3*  --   --   --  1.8*  --  1.7*  APTT 32  --   --   --   --   --   --   INR 1.19  --   --   --   --   --   --   LATICACIDVEN  --  6.22*  --  1.9  --    --   --   TROPONINI <0.30  --   --   --   --  7.08*  --   PROCALCITON  --   --   --  0.93  --   --   --   PROBNP 26162.0*  --   --   --   --   --  20537.0*  PHART  --   --  7.295*  --   --   --   --   PCO2ART  --   --  48.1*  --   --   --   --   PO2ART  --   --  78.0*  --   --   --   --     Recent Labs Lab 12/03/12 1518 12/03/12 2031 12/03/12 2358 12/04/12 0331 12/04/12 0818  GLUCAP 94 80 86 74 81    CXR: Bilateral patchy infiltrates worse on the right.- much improved [facility] CHF.   ASSESSMENT / PLAN:  PULMONARY A: 1) Acute hypoxemic respiratory failure  2) HCAP but favor pulmonary edema given markedly high BNP& rapid improvement in infx  P:   -SBts with goal extubation - Albuterol PRN  CARDIOVASCULAR A:  1) Acute decompensated heart failure. -_rapid improvement more s/o CHF than pna although CXR atypical 2) Hypertensive with SBP in the 180's at admission. Echo - EF55%, gr 3 diastolic dysfn,  Bedside US with depressed LVEF probably about  40% on presentation P:  - Lasix 40 mg IV BID  Follow BNP  RENAL A:   1) hypokalemia P:   -replete  GASTROINTESTINAL A:   1) Pancreatic cancer 2) History of C. Diff on PO vancomycin P:   -  PO pepcid - We will continue vancomycin PO while on abx (appears fully treated) -Biliary drain to stay capped  HEMATOLOGIC A:   1) No issues P:  - DVT prophylaxis with SQ heparin and SCD's - Will follow CBC  INFECTIOUS A:   1) HCAP P:   Zosyn/ vanc / fu cx  ENDOCRINE A:   1) DM 2) Hypothyroidism P:   - ICU hyperglycemia protocol with SQ insulin SS  NEUROLOGIC A:   1) No issues P:   - Intermittent sedation with Versed and Fentanyl   I have personally obtained a history, examined the patient, evaluated laboratory and imaging results, formulated the assessment and plan and placed orders. CRITICAL CARE: Critical Care Time devoted to patient care services described in this note is 35 minutes.   Cyril Mourning MD.  Tonny Bollman. [facility] Pager [phone] If no response call [phone]    12/04/2012, 9:05 AM

## 2012-12-05 ENCOUNTER — Inpatient Hospital Stay (HOSPITAL_COMMUNITY): Payer: Medicare Other

## 2012-12-05 LAB — RENAL FUNCTION PANEL
Albumin: 1.7 g/dL — ABNORMAL LOW (ref 3.5–5.2)
CO2: 30 mEq/L (ref 19–32)
Calcium: 7.9 mg/dL — ABNORMAL LOW (ref 8.4–10.5)
GFR calc Af Amer: 65 mL/min — ABNORMAL LOW (ref >90)
GFR calc non Af Amer: 56 mL/min — ABNORMAL LOW (ref >90)
Phosphorus: 3.4 mg/dL (ref 2.3–4.6)
Sodium: 136 mEq/L (ref 135–145)

## 2012-12-05 LAB — CBC
MCH: 26 pg (ref 26.0–34.0)
Platelets: 243 10*3/uL (ref 150–400)
RBC: 2.85 MIL/uL — ABNORMAL LOW (ref 3.87–5.11)
WBC: 4 10*3/uL (ref 4.0–10.5)

## 2012-12-05 MED ORDER — DARIFENACIN HYDROBROMIDE ER 15 MG PO TB24
15.0000 mg | ORAL_TABLET | Freq: Every day | ORAL | Status: DC
  Administered 2012-12-05 – 2012-12-07 (×3): 15 mg via ORAL
  Filled 2012-12-05 (×4): qty 1

## 2012-12-05 MED ORDER — PANCRELIPASE (LIP-PROT-AMYL) 12000-38000 UNITS PO CPEP
1.0000 | ORAL_CAPSULE | Freq: Three times a day (TID) | ORAL | Status: DC
  Administered 2012-12-05 – 2012-12-07 (×5): 1 via ORAL
  Filled 2012-12-05 (×9): qty 1

## 2012-12-05 MED ORDER — ESCITALOPRAM OXALATE 10 MG PO TABS
10.0000 mg | ORAL_TABLET | Freq: Every day | ORAL | Status: DC
  Administered 2012-12-05 – 2012-12-07 (×3): 10 mg via ORAL
  Filled 2012-12-05 (×3): qty 1

## 2012-12-05 MED ORDER — POTASSIUM CHLORIDE 10 MEQ/100ML IV SOLN
10.0000 meq | INTRAVENOUS | Status: AC
  Administered 2012-12-05 (×2): 10 meq via INTRAVENOUS
  Filled 2012-12-05: qty 200

## 2012-12-05 MED ORDER — TRAMADOL HCL 50 MG PO TABS
50.0000 mg | ORAL_TABLET | Freq: Four times a day (QID) | ORAL | Status: DC | PRN
  Administered 2012-12-07: 50 mg via ORAL
  Filled 2012-12-05: qty 1

## 2012-12-05 MED ORDER — MAGNESIUM SULFATE 40 MG/ML IJ SOLN
4.0000 g | Freq: Once | INTRAMUSCULAR | Status: AC
  Administered 2012-12-05: 4 g via INTRAVENOUS
  Filled 2012-12-05: qty 100

## 2012-12-05 MED ORDER — FUROSEMIDE 40 MG PO TABS
40.0000 mg | ORAL_TABLET | Freq: Two times a day (BID) | ORAL | Status: DC
  Administered 2012-12-05 – 2012-12-07 (×4): 40 mg via ORAL
  Filled 2012-12-05 (×7): qty 1

## 2012-12-05 MED ORDER — POTASSIUM CHLORIDE CRYS ER 20 MEQ PO TBCR
40.0000 meq | EXTENDED_RELEASE_TABLET | ORAL | Status: AC
  Administered 2012-12-05 (×2): 40 meq via ORAL

## 2012-12-05 NOTE — Progress Notes (Signed)
Hypokalemia   K replaced  

## 2012-12-05 NOTE — Progress Notes (Signed)
Subjective:  Sitting up in chair.  No chest pain or respiratory distress. Objective:  Vital Signs in the last 24 hours: Temp:  [97.8 F (36.6 C)-100.8 F (38.2 C)] 98.2 F (36.8 C) (05/14 0700) Pulse Rate:  [57-83] 64 (05/14 0700) Resp:  [0-32] 22 (05/14 0700) BP: (112-152)/(52-69) 129/59 mmHg (05/14 0600) SpO2:  [95 %-99 %] 97 % (05/14 0700) FiO2 (%):  [40 %] 40 % (05/13 0936) Weight:  [145 lb 8.1 oz (66 kg)] 145 lb 8.1 oz (66 kg) (05/14 0500)  Intake/Output from previous day: 05/13 0701 - 05/14 0700 In: 1570 [P.O.:840; I.V.:80; IV Piggyback:650] Out: 3982 [Urine:3780; Stool:202] Intake/Output from this shift:    . ARIPiprazole  5 mg Oral Daily  . aspirin  81 mg Oral Daily  . Chlorhexidine Gluconate Cloth  6 each Topical Q0600  . famotidine  20 mg Oral BID  . furosemide  40 mg Intravenous Q12H  . heparin  5,000 Units Subcutaneous Q8H  . insulin aspart  2-6 Units Subcutaneous Q4H  . levothyroxine  112 mcg Per NG tube QAC breakfast  . mupirocin ointment  1 application Nasal BID  . nebivolol  5 mg Oral Daily  . piperacillin-tazobactam (ZOSYN)  IV  3.375 g Intravenous Q8H  . potassium chloride  40 mEq Oral Daily  . potassium chloride  40 mEq Oral Q4H  . vancomycin  125 mg Per NG tube Q6H  . vancomycin  500 mg Intravenous Q12H      Physical Exam: The patient appears to be in no distress.  Head and neck exam reveals that the pupils are equal and reactive.  The extraocular movements are full.  There is no scleral icterus.  Mouth and pharynx are benign.  No lymphadenopathy.  No carotid bruits.  The jugular venous pressure is normal.  Thyroid is not enlarged or tender.  Chest reveals inspiratory rales at right base.  Heart reveals no abnormal lift or heave.  First and second heart sounds are normal.  There is no murmur gallop rub or click.  The abdomen is soft and nontender.  Bowel sounds are normoactive.  There is no hepatosplenomegaly or mass.  There are no abdominal  bruits.  Extremities reveal chronic mild bilateral edema Neurologic exam is normal strength and no lateralizing weakness.  No sensory deficits.  Integument reveals no rash  Lab Results:  Recent Labs  12/04/12 0400 12/05/12 0430  WBC 3.7* 4.0  HGB 7.6* 7.4*  PLT 216 243    Recent Labs  12/04/12 1810 12/05/12 0430  NA 139 136  K 3.5 2.9*  CL 101 99  CO2 28 30  GLUCOSE 102* 145*  BUN 12 11  CREATININE 1.06 0.96    Recent Labs  12/03/12 1600 12/05/12 0430  TROPONINI 7.08* 2.08*   Hepatic Function Panel  Recent Labs  12/03/12 0936  12/05/12 0430  PROT 4.8*  --   --   ALBUMIN 1.8*  < > 1.7*  AST 51*  --   --   ALT 25  --   --   ALKPHOS 86  --   --   BILITOT 0.4  --   --   < > = values in this interval not displayed. No results found for this basename: CHOL,  in the last 72 hours No results found for this basename: PROTIME,  in the last 72 hours  Imaging: Dg Chest Port 1 View  12/05/2012   *RADIOLOGY REPORT*  Clinical Data: Pneumonia  PORTABLE CHEST -  1 VIEW  Comparison: 12/04/2012  Findings: Endotracheal tube has been removed in the interval. Right Port-A-Cath remains in place. The cardiopericardial silhouette is enlarged.  Retrocardiac collapse / consolidation with left pleural effusion persist.  There is residual airspace disease at the right base. Telemetry leads overlie the chest.  IMPRESSION: Interval extubation and NG tube removal.  Cardiomegaly with left greater than right bibasilar collapse / consolidation.  There is minimal improvement in aeration at the right base.   Original Report Authenticated By: Kennith Center, M.D.   Dg Chest Port 1 View  12/04/2012   *RADIOLOGY REPORT*  Clinical Data: Shortness of breath.  Edema.  PORTABLE CHEST - 1 VIEW  Comparison: Chest 12/02/2012.  Findings: Support tubes and lines are unchanged.  Aeration in the right upper lung zone has markedly improved.  There is some increased airspace opacity in the right lung base likely  due to a combination of effusion and airspace disease.  Left basilar opacity is not markedly changed.  No pneumothorax is identified.  Remote left rib fractures are identified.  IMPRESSION: Marked improvement in aeration in the right upper lung zone. Worsened aeration in the right lung base is likely due to a combination of increased pleural fluid and airspace disease.   Original Report Authenticated By: Holley Dexter, M.D.    Cardiac Studies: Telemetry shows NSR EKG yesterday shows widespread anterior T wave inversion consistent with NSTEMI Assessment/Plan:  1. Acute Respiratory Failure: Significantly improved since yesterday.  2. Chronic combined systolic and diastolic CHF: Echo performed @ [facility] last month showed EF 40-45% with pulm HTN. Repeat today shows EF of 50-55% w/o wall motion abnormalities.  3. HTN: As above.  4. Iron deficiency anemia: Per IM. Reviw of care everywhere shows that she has received outpt transfusions - last 5/9.  5. C diff: Vanc per IM.  6. Pancreatic CA: Followed as outpt @ [facility]. It is our understanding that her cancer is under reasonable control at this time. 7. NSTEMI secondary to demand ischemia from CHF, pneumonia and severe anemia, with EKG changes and elevated troponin.  Troponin trending down 2.08  Plan:  Continue ASA. Continue BB. Change lasix to p.o.  LOS: 3 days    Cassell Clement 12/05/2012, 8:10 AM

## 2012-12-05 NOTE — Progress Notes (Signed)
INITIAL NUTRITION ASSESSMENT  DOCUMENTATION CODES Per approved criteria  -Not Applicable   INTERVENTION:  Snacks TID between meals to maximize oral intake.  NUTRITION DIAGNOSIS: Inadequate oral intake related to poor appetite as evidenced by poor intake of meals.   Goal: Intake to meet >90% of estimated nutrition needs.  Monitor:  PO intake, labs, weight trend  Reason for Assessment: MST=2  78 y.o. female  Admitting Dx: Acute respiratory failure  ASSESSMENT: Patient presented to the hospital with sudden onset of SOB, required intubation. Was extubated 5/13. Receiving Lasix for acute decompensated heart failure. Diet has been advanced to CHO-modified medium calorie. Hypokalemia is being replaced.   Patient reports that she lost weight from 150 lb down to 139 lb ~4 months ago. Has since gained back up to 145 lb. PO intake is poor due to poor appetite and dislike of the food she receives on her meal trays. She does not want to try a PO supplement because someone told her it would give her diarrhea. Is agreeable to snacks between meals. Suspect weight is variable with fluid status.  Height: Ht Readings from Last 1 Encounters:  12/03/12 5\' 2"  (1.575 m)    Weight: Wt Readings from Last 1 Encounters:  12/05/12 145 lb 8.1 oz (66 kg)    Ideal Body Weight: 50 kg  % Ideal Body Weight: 132%  Wt Readings from Last 10 Encounters:  12/05/12 145 lb 8.1 oz (66 kg)  12/05/12 145 lb 8.1 oz (66 kg)    Usual Body Weight: 150 lb  % Usual Body Weight: 97%  BMI:  Body mass index is 26.61 kg/(m^2).  Estimated Nutritional Needs: Kcal: 1500-1700 Protein: 75-85 gm Fluid: 1.5 L  Skin: no problems noted  Diet Order: Carb Control  EDUCATION NEEDS: -Education not appropriate at this time   Intake/Output Summary (Last 24 hours) at 12/05/12 1442 Last data filed at 12/05/12 0800  Gross per 24 hour  Intake    750 ml  Output   2682 ml  Net  -1932 ml   Last BM:   5/13  Labs:   Recent Labs Lab 12/03/12 0936 12/04/12 0400 12/04/12 1810 12/05/12 0430  NA 138 141 139 136  K 3.7 2.9* 3.5 2.9*  CL 105 103 101 99  CO2 28 29 28 30   BUN 12 12 12 11   CREATININE 1.00 1.01 1.06 0.96  CALCIUM 7.6* 7.6* 8.0* 7.9*  MG  --  1.2*  --   --   PHOS  --  3.9  --  3.4  GLUCOSE 90 80 102* 145*    CBG (last 3)   Recent Labs  12/05/12 0342 12/05/12 0749 12/05/12 1111  GLUCAP 129* 111* 186*    Scheduled Meds: . ARIPiprazole  5 mg Oral Daily  . aspirin  81 mg Oral Daily  . Chlorhexidine Gluconate Cloth  6 each Topical Q0600  . escitalopram  10 mg Oral Daily  . famotidine  20 mg Oral BID  . furosemide  40 mg Oral BID  . heparin  5,000 Units Subcutaneous Q8H  . insulin aspart  2-6 Units Subcutaneous Q4H  . levothyroxine  112 mcg Per NG tube QAC breakfast  . lipase/protease/amylase  1 capsule Oral TID WC  . mupirocin ointment  1 application Nasal BID  . nebivolol  5 mg Oral Daily  . piperacillin-tazobactam (ZOSYN)  IV  3.375 g Intravenous Q8H  . potassium chloride  40 mEq Oral Daily  . vancomycin  125 mg Per NG tube  Q6H    Continuous Infusions:   Past Medical History  Diagnosis Date  . Pancreatic adenoma   . Pancreatic cancer 12/03/2012  . Diabetes mellitus 12/03/2012  . Hypothyroidism 12/03/2012  . Clostridium difficile enterocolitis   . Depression   . Cardiomyopathy     a. 10/2012 Echo: EF 40-45%, basal inf, inflat, infsept HK, aortic sclerosis, mildly dil LA, mod MR, mild TR, mod PAH;  b. 11/2012 Echo: EF 50-55%, no rwma, gr 3 DD, Ao sclerosis, Mild MVP w mild to mod MR, mildly dil LA.  Marland Kitchen Hypertension   . Anxiety   . Iron deficiency anemia     a. h/o transfusion, most recently 10/2012 - [facility]    Past Surgical History  Procedure Laterality Date  . Gastric bypass    . Abdominal hysterectomy    . Back surgery    . Cervical fusion    . Right total knee replacement    . Cholecystectomy    . Appendectomy    . Bilateral cataract surgeries       Joaquin Courts, RD, LDN, CNSC Pager [phone] After Hours Pager [phone]

## 2012-12-05 NOTE — Progress Notes (Signed)
PULMONARY  / CRITICAL CARE MEDICINE  Name: Tonya Wallace MRN: [id number] DOB: 1934/11/01    ADMISSION DATE:  12/02/2012  PRIMARY SERVICE: PCCM  CHIEF COMPLAINT:  Acute respiratory failure.  BRIEF PATIENT DESCRIPTION:  78 years old female with pancreatic cancer. Presents with sudden onset of SOB. At admission found profoundly hypoxemic requiring intubation. Chest X ray with bilateral infiltrates. History obtained by [facility] (via epic) :EF 40%, biliary drain placed in jan '14, recent venous duplex neg, C diff & UTI being treated with cipro & PO vanc   LINES / TUBES: - Peripheral IV's - Right port- A-cath  CULTURES: - Blood cultures 5/11 >>ng - Urine cultures5/11 >>ng  respiratory cultures. 5/11 >>candida C diff pcr neg  ANTIBIOTICS: - Zosyn 5/11 >> - Vancomycin 5/11 >>5/14 PO vanc >>  HISTORY OF PRESENT ILLNESS:   78 years old female with PMH relevant for pancreatic cancer, DM, hypothyroidism and depression. Presents with sudden onset of SOB. Famile denies any cough, sputum production or fever. At arrival to the ED profoundly hypoxemic requiring intubation. Chest X ray with bilateral infiltrates suggestive of multifocal pneumonia vs CHF. BNP [id number]. At the time of my exam the patient is intubated, sedated, hemodynamically stable. Got Lasix 80 mg IV in the ED.   SUBJECTIVE: denies pain, breathing better, weaning well, low gr fever on 5/12 oob to cahir Lt ankle pain - much improved  VITAL SIGNS: Temp:  [97.8 F (36.6 C)-100.8 F (38.2 C)] 98.2 F (36.8 C) (05/14 0800) Pulse Rate:  [57-83] 82 (05/14 0900) Resp:  [17-32] 20 (05/14 0900) BP: (112-152)/(52-73) 139/69 mmHg (05/14 0900) SpO2:  [91 %-99 %] 91 % (05/14 0900) Weight:  [66 kg (145 lb 8.1 oz)] 66 kg (145 lb 8.1 oz) (05/14 0500) HEMODYNAMICS:   VENTILATOR SETTINGS:   INTAKE / OUTPUT: Intake/Output     05/13 0701 - 05/14 0700 05/14 0701 - 05/15 0700   P.O. 840    I.V. (mL/kg) 80 (1.2) 20 (0.3)    IV Piggyback 650    Total Intake(mL/kg) 1570 (23.8) 20 (0.3)   Urine (mL/kg/hr) 4030 (2.5)    Stool 202 (0.1)    Total Output 4232     Net -2662 +20        Stool Occurrence 6 x 2 x     PHYSICAL EXAMINATION: General: No apparent distress.  Eyes: Anicteric sclerae. ENT: ETT in place. Trachea at midline. Lymph: No cervical, supraclavicular, or axillary lymphadenopathy. Heart: Normal S1, S2. No murmurs, rubs, or gallops appreciated. No bruits, equal pulses. Lungs: Bilateral diffuse crackles. No wheezing. Abdomen: Abdomen soft, non-tender and not distended, normoactive bowel sounds. Biliary drainage in place.  Musculoskeletal: No clubbing or synovitis. 3+ LE edema. Skin: No rashes or lesions Neuro:  interactive. Moves all 4 extremities. LABS:  Recent Labs Lab 12/02/12 2349 12/03/12 0003 12/03/12 0031 12/03/12 0309 12/03/12 0936 12/03/12 1600 12/04/12 0400 12/04/12 0921 12/04/12 1810 12/05/12 0430  HGB 9.9*  --   --   --  7.9*  --  7.6*  --   --  7.4*  WBC 12.4*  --   --   --  5.3  --  3.7*  --   --  4.0  PLT 330  --   --   --  202  --  216  --   --  243  NA 136  --   --   --  138  --  141  --  139 136  K 4.2  --   --   --  3.7  --  2.9*  --  3.5 2.9*  CL 100  --   --   --  105  --  103  --  101 99  CO2 19  --   --   --  28  --  29  --  28 30  GLUCOSE 286*  --   --   --  90  --  80  --  102* 145*  BUN 12  --   --   --  12  --  12  --  12 11  CREATININE 0.94  --   --   --  1.00  --  1.01  --  1.06 0.96  CALCIUM 8.3*  --   --   --  7.6*  --  7.6*  --  8.0* 7.9*  MG  --   --   --   --   --   --  1.2*  --   --   --   PHOS  --   --   --   --   --   --  3.9  --   --  3.4  AST 42*  --   --   --  51*  --   --   --   --   --   ALT 30  --   --   --  25  --   --   --   --   --   ALKPHOS 118*  --   --   --  86  --   --   --   --   --   BILITOT 0.4  --   --   --  0.4  --   --   --   --   --   PROT 6.1  --   --   --  4.8*  --   --   --   --   --   ALBUMIN 2.3*  --   --   --  1.8*   --  1.7*  --   --  1.7*  APTT 32  --   --   --   --   --   --   --   --   --   INR 1.19  --   --   --   --   --   --   --   --   --   LATICACIDVEN  --  6.22*  --  1.9  --   --   --   --   --   --   TROPONINI <0.30  --   --   --   --  7.08*  --   --   --  2.08*  PROCALCITON  --   --   --  0.93  --   --   --   --   --   --   PROBNP 26162.0*  --   --   --   --   --  20537.0*  --   --   --   PHART  --   --  7.295*  --   --   --   --  7.415  --   --   PCO2ART  --   --  48.1*  --   --   --   --  41.9  --   --  PO2ART  --   --  78.0*  --   --   --   --  109.0*  --   --     Recent Labs Lab 12/04/12 1537 12/04/12 2008 12/04/12 2327 12/05/12 0342 12/05/12 0749  GLUCAP 104* 157* 71 129* 111*    CXR: Bilateral patchy infiltrates worse on the right.- much improved [facility] CHF.   ASSESSMENT / PLAN:  PULMONARY A: 1) Acute hypoxemic respiratory failure  2) HCAP but favor pulmonary edema given markedly high BNP& rapid improvement in infx  P:   -doing well post extubation - Albuterol PRN  CARDIOVASCULAR A:  1) Acute decompensated heart failure. -_rapid improvement more s/o CHF than pna although CXR atypical 2) Hypertensive with SBP in the 180's at admission. Echo - EF55%, gr 3 diastolic dysfn,  Bedside US with depressed LVEF probably about  40% on presentation P:  - Lasix 40 mg IV BID  Follow BNP - resume bystolic  RENAL A:   1) hypokalemia/ hYpomag P:   -repleted  GASTROINTESTINAL A:   1) Pancreatic cancer 2) History of C. Diff on PO vancomycin - rpt PCR neg P:   -PO pepcid - We will continue vancomycin PO while on abx (appears fully treated) -Biliary drain to stay capped  HEMATOLOGIC A:   1) No issues P:  - DVT prophylaxis with SQ heparin and SCD's - Will follow CBC  INFECTIOUS A:   1) HCAP P:   Zosyn x 5-7 ds  ENDOCRINE A:   1) DM 2) Hypothyroidism P:   - SSI order set  NEUROLOGIC A:   1) No issues  OK to transfer to tele under [facility] pulm  edema rather than pneumonia. We will inform her oncologist of events (Dr Lavella Lemons)  I have personally obtained a history, examined the patient, evaluated laboratory and imaging results, formulated the assessment and plan and placed orders.   Cyril Mourning MD. Tonny Bollman. [facility] Pager [phone] If no response call [phone]    12/05/2012, 9:40 AM

## 2012-12-05 NOTE — Evaluation (Signed)
Physical Therapy Evaluation Patient Details Name: Tonya Wallace MRN: [id number] DOB: 10-29-1934 Today's Date: 12/05/2012 Time: 0102-7253 PT Time Calculation (min): 30 min  PT Assessment / Plan / Recommendation Clinical Impression  Pt s/p pancreatic CA with VDRF with decr mobility secondary to decr endurance, decr balance and decr poor postural stability.  Will benefit from PT to address endurance and balance.  Has care at home all bu 2 hours day.  Has equipment.  HHPT and HHOT recommended.      PT Assessment  Patient needs continued PT services    Follow Up Recommendations  Home health PT;Supervision - Intermittent                Equipment Recommendations  None recommended by PT       Frequency Min 3X/week    Precautions / Restrictions Precautions Precautions: Fall Restrictions Weight Bearing Restrictions: No   Pertinent Vitals/Pain Desat to 83% on RA with ambulation.  88 % and greater with pursed lip breathing and >92% with 2 LO2.  No pain.      Mobility  Bed Mobility Bed Mobility: Rolling Right;Right Sidelying to Sit Rolling Right: 4: Min guard Right Sidelying to Sit: 4: Min guard;HOB flat Details for Bed Mobility Assistance: incr time Transfers Transfers: Sit to Stand;Stand to Sit Sit to Stand: 4: Min assist;With upper extremity assist;From bed Stand to Sit: 4: Min assist;With upper extremity assist;With armrests;To chair/3-in-1 Details for Transfer Assistance: Needed min assist to steady pt initially upon standing.   Ambulation/Gait Ambulation/Gait Assistance: 4: Min assist Ambulation Distance (Feet): 100 Feet Assistive device: Rolling walker Ambulation/Gait Assistance Details: Pt needed cues to stay close to RW and for upright posture.  Needed cues for pursed lip breathing to keep sats >88% on RA with ambulation.  Reapplied O2 after walk.   Gait Pattern: Step-through pattern;Trunk flexed;Wide base of support Gait velocity: decreased Stairs: No Wheelchair  Mobility Wheelchair Mobility: No    Exercises General Exercises - Lower Extremity Ankle Circles/Pumps: AROM;Both;10 reps;Supine Long Arc Quad: AROM;Both;10 reps;Seated Heel Slides: AROM;Both;10 reps;Supine   PT Diagnosis: Generalized weakness  PT Problem List: Decreased activity tolerance;Decreased balance;Decreased mobility;Decreased knowledge of use of DME;Decreased safety awareness;Decreased knowledge of precautions PT Treatment Interventions: DME instruction;Gait training;Stair training;Functional mobility training;Therapeutic activities;Therapeutic exercise;Balance training   PT Goals Acute Rehab PT Goals PT Goal Formulation: With patient Time For Goal Achievement: 12/19/12 Potential to Achieve Goals: Good Pt will go Supine/Side to Sit: Independently PT Goal: Supine/Side to Sit - Progress: Goal set today Pt will go Sit to Stand: Independently PT Goal: Sit to Stand - Progress: Goal set today Pt will Ambulate: 51 - 150 feet;with modified independence;with least restrictive assistive device PT Goal: Ambulate - Progress: Goal set today Pt will Go Up / Down Stairs: 6-9 stairs;with min assist;with least restrictive assistive device PT Goal: Up/Down Stairs - Progress: Goal set today  Visit Information  Last PT Received On: 12/05/12 Assistance Needed: +1    Subjective Data  Subjective: "I would like to get up." Patient Stated Goal: To go home   Prior Functioning  Home Living Lives With: Family Available Help at Discharge: Family;Available PRN/intermittently;Other (Comment) (alone about 2 hours day) Type of Home: House Home Access: Stairs to enter [employer] of Steps: 9 Entrance Stairs-Rails: Can reach both Home Layout: Two level;Able to live on main level with bedroom/bathroom Alternate Level Stairs-Number of Steps: flight (doesn't use those a lot) Alternate Level Stairs-Rails: Left Bathroom Shower/Tub: Walk-in shower Bathroom Toilet: Standard (vanity  beside) Home  Adaptive Equipment: Built-in shower seat;Hand-held shower hose;Walker - rolling Prior Function Level of Independence: Independent Able to Take Stairs?: Yes Driving: No Vocation: Retired Musician: No difficulties Dominant Hand: Right    Cognition  Cognition Arousal/Alertness: Awake/alert Behavior During Therapy: WFL for tasks assessed/performed Overall Cognitive Status: Within Functional Limits for tasks assessed    Extremity/Trunk Assessment Right Lower Extremity Assessment RLE ROM/Strength/Tone: [facility] for tasks assessed Left Lower Extremity Assessment LLE ROM/Strength/Tone: [facility] for tasks assessed   Balance Static Sitting Balance Static Sitting - Balance Support: Feet supported;No upper extremity supported Static Sitting - Level of Assistance: 5: Stand by assistance Static Sitting - Comment/# of Minutes: 3 Static Standing Balance Static Standing - Balance Support: Bilateral upper extremity supported;During functional activity Static Standing - Level of Assistance: 4: Min assist Static Standing - Comment/# of Minutes: Pt needed RW for standing and min assist with RW.    End of Session PT - End of Session Equipment Utilized During Treatment: Gait belt;Oxygen Activity Tolerance: Patient limited by fatigue Patient left: in chair;with call bell/phone within reach Nurse Communication: Mobility status       INGOLD,Nels Munn 12/05/2012, 1:51 PM [facility] [phone] (pager)

## 2012-12-06 DIAGNOSIS — I5043 Acute on chronic combined systolic (congestive) and diastolic (congestive) heart failure: Secondary | ICD-10-CM

## 2012-12-06 DIAGNOSIS — E876 Hypokalemia: Secondary | ICD-10-CM

## 2012-12-06 DIAGNOSIS — J189 Pneumonia, unspecified organism: Secondary | ICD-10-CM

## 2012-12-06 DIAGNOSIS — C259 Malignant neoplasm of pancreas, unspecified: Secondary | ICD-10-CM

## 2012-12-06 LAB — BASIC METABOLIC PANEL
CO2: 30 mEq/L (ref 19–32)
Chloride: 102 mEq/L (ref 96–112)
Glucose, Bld: 77 mg/dL (ref 70–99)
Potassium: 3.3 mEq/L — ABNORMAL LOW (ref 3.5–5.1)
Sodium: 139 mEq/L (ref 135–145)

## 2012-12-06 LAB — GLUCOSE, CAPILLARY
Glucose-Capillary: 87 mg/dL (ref 70–99)
Glucose-Capillary: 113 mg/dL — ABNORMAL HIGH (ref 70–99)

## 2012-12-06 LAB — CBC
Hemoglobin: 7.9 g/dL — ABNORMAL LOW (ref 12.0–15.0)
MCH: 25.9 pg — ABNORMAL LOW (ref 26.0–34.0)
MCV: 80 fL (ref 78.0–100.0)
RBC: 3.05 MIL/uL — ABNORMAL LOW (ref 3.87–5.11)

## 2012-12-06 LAB — PRO B NATRIURETIC PEPTIDE: Pro B Natriuretic peptide (BNP): 20243 pg/mL — ABNORMAL HIGH (ref 0–450)

## 2012-12-06 MED ORDER — SODIUM CHLORIDE 0.9 % IJ SOLN
10.0000 mL | INTRAMUSCULAR | Status: DC | PRN
  Administered 2012-12-06 – 2012-12-07 (×3): 10 mL

## 2012-12-06 MED ORDER — INSULIN ASPART 100 UNIT/ML ~~LOC~~ SOLN
0.0000 [IU] | Freq: Three times a day (TID) | SUBCUTANEOUS | Status: DC
  Administered 2012-12-06: 2 [IU] via SUBCUTANEOUS

## 2012-12-06 MED ORDER — POTASSIUM CHLORIDE CRYS ER 20 MEQ PO TBCR
40.0000 meq | EXTENDED_RELEASE_TABLET | Freq: Once | ORAL | Status: AC
  Administered 2012-12-06: 40 meq via ORAL

## 2012-12-06 MED ORDER — VANCOMYCIN 50 MG/ML ORAL SOLUTION
125.0000 mg | Freq: Four times a day (QID) | ORAL | Status: DC
  Administered 2012-12-06 – 2012-12-07 (×3): 125 mg via ORAL
  Filled 2012-12-06 (×7): qty 2.5

## 2012-12-06 NOTE — Progress Notes (Signed)
ANTIBIOTIC CONSULT NOTE   Pharmacy Consult for zosyn Indication: rule out pneumonia  Allergies  Allergen Reactions  . Fentanyl Itching    Patient Measurements: Height: 5\' 2"  (157.5 cm) Weight: 142 lb (64.411 kg) IBW/kg (Calculated) : 50.1 Adjusted Body Weight:   Vital Signs: Temp: 98.3 F (36.8 C) (05/15 0423) Temp src: Oral (05/15 0423) BP: 160/82 mmHg (05/15 0423) Pulse Rate: 67 (05/15 0423) Intake/Output from previous day: 05/14 0701 - 05/15 0700 In: 962.7 [P.O.:360; I.V.:452.7; IV Piggyback:150] Out: 1451 [Urine:1450; Stool:1] Intake/Output from this shift: Total I/O In: 240 [P.O.:240] Out: -   Labs:  Recent Labs  12/04/12 0400 12/04/12 1810 12/05/12 0430 12/06/12 0450  WBC 3.7*  --  4.0 4.5  HGB 7.6*  --  7.4* 7.9*  PLT 216  --  243 299  CREATININE 1.01 1.06 0.96 0.84   Estimated Creatinine Clearance: 50.2 ml/min (by C-G formula based on Cr of 0.84). No results found for this basename: VANCOTROUGH, Leodis Binet, VANCORANDOM, GENTTROUGH, GENTPEAK, GENTRANDOM, TOBRATROUGH, TOBRAPEAK, TOBRARND, AMIKACINPEAK, AMIKACINTROU, AMIKACIN,  in the last 72 hours   Microbiology: Recent Results (from the past 720 hour(s))  URINE CULTURE     Status: None   Collection Time    12/03/12 12:30 AM      Result Value Range Status   Specimen Description URINE, CATHETERIZED   Final   Special Requests NONE   Final   Culture  Setup Time 12/03/2012 11:15   Final   Colony Count NO GROWTH   Final   Culture NO GROWTH   Final   Report Status 12/04/2012 FINAL   Final  CULTURE, BLOOD (ROUTINE X 2)     Status: None   Collection Time    12/03/12  3:08 AM      Result Value Range Status   Specimen Description BLOOD RIGHT HAND   Final   Special Requests BOTTLES DRAWN AEROBIC ONLY 10CC   Final   Culture  Setup Time 12/03/2012 10:53   Final   Culture     Final   Value:        BLOOD CULTURE RECEIVED NO GROWTH TO DATE CULTURE WILL BE HELD FOR 5 DAYS BEFORE ISSUING A FINAL NEGATIVE REPORT    Report Status PENDING   Incomplete  CULTURE, BLOOD (ROUTINE X 2)     Status: None   Collection Time    12/03/12  3:15 AM      Result Value Range Status   Specimen Description BLOOD LEFT ARM   Final   Special Requests     Final   Value: BOTTLES DRAWN AEROBIC AND ANAEROBIC 10CC BLUE 5CC RED   Culture  Setup Time 12/03/2012 10:54   Final   Culture     Final   Value:        BLOOD CULTURE RECEIVED NO GROWTH TO DATE CULTURE WILL BE HELD FOR 5 DAYS BEFORE ISSUING A FINAL NEGATIVE REPORT   Report Status PENDING   Incomplete  MRSA PCR SCREENING     Status: Abnormal   Collection Time    12/03/12  4:00 AM      Result Value Range Status   MRSA by PCR POSITIVE (*) NEGATIVE Final   Comment:            The GeneXpert MRSA Assay (FDA     approved for NASAL specimens     only), is one component of a     comprehensive MRSA colonization     surveillance program. It is not  intended to diagnose MRSA     infection nor to guide or     monitor treatment for     MRSA infections.     RESULT CALLED TO, READ BACK BY AND VERIFIED WITH:     HAYES,C RN [phone] 12/03/12 MITCHELL,L  CULTURE, RESPIRATORY (NON-EXPECTORATED)     Status: None   Collection Time    12/03/12  7:33 AM      Result Value Range Status   Specimen Description TRACHEAL ASPIRATE   Final   Special Requests Normal   Final   Gram Stain     Final   Value: RARE WBC PRESENT, PREDOMINANTLY MONONUCLEAR     NO SQUAMOUS EPITHELIAL CELLS SEEN     NO ORGANISMS SEEN   Culture RARE CANDIDA ALBICANS   Final   Report Status 12/05/2012 FINAL   Final  CLOSTRIDIUM DIFFICILE BY PCR     Status: None   Collection Time    12/03/12 12:17 PM      Result Value Range Status   C difficile by pcr NEGATIVE  NEGATIVE Final    Medical History: Past Medical History  Diagnosis Date  . Pancreatic adenoma   . Pancreatic cancer 12/03/2012  . Diabetes mellitus 12/03/2012  . Hypothyroidism 12/03/2012  . Clostridium difficile enterocolitis   . Depression   .  Cardiomyopathy     a. 10/2012 Echo: EF 40-45%, basal inf, inflat, infsept HK, aortic sclerosis, mildly dil LA, mod MR, mild TR, mod PAH;  b. 11/2012 Echo: EF 50-55%, no rwma, gr 3 DD, Ao sclerosis, Mild MVP w mild to mod MR, mildly dil LA.  Marland Kitchen Hypertension   . Anxiety   . Iron deficiency anemia     a. h/o transfusion, most recently 10/2012 - [facility]    Medications:  Prescriptions prior to admission  Medication Sig Dispense Refill  . aliskiren (TEKTURNA) 300 MG tablet Take 300 mg by mouth daily.      . Alum & Mag Hydroxide-Simeth (MAGIC MOUTHWASH) SOLN Take 15 mLs by mouth 4 (four) times daily.      . ARIPiprazole (ABILIFY) 5 MG tablet Take 5 mg by mouth daily.      . ciprofloxacin (CIPRO) 500 MG tablet Take 500 mg by mouth every 12 (twelve) hours. For 13 days      . clonazePAM (KLONOPIN) 0.5 MG tablet Take 0.5 mg by mouth 2 (two) times daily as needed for anxiety.      . diphenoxylate-atropine (LOMOTIL) 2.5-0.025 MG per tablet Take 1 tablet by mouth 4 (four) times daily as needed for diarrhea or loose stools.      Marland Kitchen escitalopram (LEXAPRO) 10 MG tablet Take 10 mg by mouth daily.      . furosemide (LASIX) 20 MG tablet Take 20 mg by mouth daily.      Marland Kitchen levothyroxine (SYNTHROID, LEVOTHROID) 112 MCG tablet Take 112 mcg by mouth daily before breakfast.      . lidocaine-prilocaine (EMLA) cream Apply 1 application topically as needed (Apply 30 gm 45 minutes before port).      . lipase/protease/amylase (CREON-12/PANCREASE) 12000 UNITS CPEP Take 1 capsule by mouth 3 (three) times daily with meals.      . Multiple Vitamin (MULTIVITAMIN WITH MINERALS) TABS Take 1 tablet by mouth daily.      . nebivolol (BYSTOLIC) 5 MG tablet Take 5 mg by mouth daily.      . ondansetron (ZOFRAN) 8 MG tablet Take by mouth every 8 (eight) hours as needed for nausea.      Marland Kitchen  solifenacin (VESICARE) 10 MG tablet Take 10 mg by mouth daily.      . traMADol (ULTRAM) 50 MG tablet Take 50 mg by mouth every 6 (six) hours as needed for  pain.      . Vancomycin HCl 25 MG/ML SOLN Take 125 mg by mouth every 6 (six) hours. For 21 days      . zolpidem (AMBIEN CR) 6.25 MG CR tablet Take 6.25 mg by mouth at bedtime as needed for sleep.       Assessment: 78 yo female with pancreatic cancer with sudden onset sob and hypoxemia requiring intubation. Chest xray reveals bilateral infiltrates.  Goal of Therapy:  Eradication of infection  Plan:  Zosyn 3.375 gm q8h (4 hour infusion)    Tonya Wallace 12/06/2012,11:05 AM

## 2012-12-06 NOTE — Progress Notes (Signed)
Physical Therapy Treatment Patient Details Name: Tonya Wallace MRN: [id number] DOB: June 28, 1935 Today's Date: 12/06/2012 Time: 0837-0900 PT Time Calculation (min): 23 min  PT Assessment / Plan / Recommendation Comments on Treatment Session       Follow Up Recommendations  Home health PT;Supervision - Intermittent     Does the patient have the potential to tolerate intense rehabilitation     Barriers to Discharge        Equipment Recommendations  None recommended by PT    Recommendations for Other Services    Frequency Min 3X/week   Plan Discharge plan remains appropriate    Precautions / Restrictions Precautions Precautions: Fall Restrictions Weight Bearing Restrictions: No       Mobility  Bed Mobility Bed Mobility: Supine to Sit;Sitting - Scoot to Edge of Bed Supine to Sit: 5: Supervision;HOB elevated;With rails Sitting - Scoot to Edge of Bed: 5: Supervision Details for Bed Mobility Assistance: incr time Transfers Transfers: Sit to Stand;Stand to Sit;Stand Pivot Transfers Sit to Stand: 4: Min guard;With upper extremity assist;From bed;From chair/3-in-1 Stand to Sit: 4: Min guard;With upper extremity assist;With armrests;To chair/3-in-1 Stand Pivot Transfers: 4: Min guard Details for Transfer Assistance: Cues for safest hand placement.   Ambulation/Gait Ambulation/Gait Assistance: 4: Min guard Ambulation Distance (Feet): 200 Feet Assistive device: Rolling walker Ambulation/Gait Assistance Details: Cues for pursed lip breathing, stay closer to RW, & tall posture.  Pt denies SOB.   Gait Pattern: Step-through pattern;Decreased stride length Gait velocity: decreased Stairs: No Wheelchair Mobility Wheelchair Mobility: No    Exercises General Exercises - Lower Extremity Ankle Circles/Pumps: AROM;Both;15 reps Long Arc Quad: Strengthening;Both;10 reps Heel Slides: AROM;Both;10 reps Hip Flexion/Marching: AROM;Strengthening;Both;10 reps Toe Raises: AROM;Both;10  reps Heel Raises: AROM;Both;10 reps     PT Goals Acute Rehab PT Goals Time For Goal Achievement: 12/19/12 Potential to Achieve Goals: Good Pt will go Supine/Side to Sit: Independently PT Goal: Supine/Side to Sit - Progress: Progressing toward goal Pt will go Sit to Stand: Independently PT Goal: Sit to Stand - Progress: Progressing toward goal Pt will Ambulate: 51 - 150 feet;with modified independence;with least restrictive assistive device PT Goal: Ambulate - Progress: Progressing toward goal Pt will Go Up / Down Stairs: 6-9 stairs;with min assist;with least restrictive assistive device  Visit Information  Last PT Received On: 12/06/12 Assistance Needed: +1    Subjective Data      Cognition  Cognition Arousal/Alertness: Awake/alert Behavior During Therapy: WFL for tasks assessed/performed Overall Cognitive Status: Within Functional Limits for tasks assessed    Balance     End of Session PT - End of Session Equipment Utilized During Treatment: Gait belt Activity Tolerance: Patient tolerated treatment well Patient left: in chair;with call bell/phone within reach;with nursing in room Nurse Communication: Mobility status     Verdell Face, [place] [phone] 12/06/2012

## 2012-12-06 NOTE — Progress Notes (Signed)
[facility] PROGRESS NOTE  Tonya Wallace NFA:[id number] DOB: 11-15-1934 DOA: 12/02/2012 PCP: Provider Not In System  Assessment/Plan: Acute respiratory failure -Likely due to a combination of HCAP and pulm edema -Fever pulmonary edema given rapid improvement, the patient did have lactic acidosis at the time of admission, 6.22 and elevated procalcitonin 0.93 -Status post extubation -Remained stable on room air Acute diastolic CHF -12/03/2012 echo EF 50-55%, grade 3 diastolic dysfunction, mild to moderate MR -Appreciate cardiology followup -Continue oral furosemide --4884 cc urine output for the admission Continue beta blockade HCAP -Continue Zosyn which was started on 12/03/2012 -If remains clinically stable, plan to switch to oral antibiotics on May 16 Hypokalemia -Replete -Check magnesium History of Clostridium difficile colitis -Diagnosed when she was at [facility] [place] -Continue vancomycin solution PO -C. difficile PCR negative during this admission Pancreatic cancer -Biliary drain to stay capped -Followup with Dr. Lavella Lemons at [facility] after d/c -Last chemotherapy 3 weeks ago Hyperglycemia -Check hemoglobin A1c -Continue NovoLog sliding scale Deconditioning -Continue PT -Consult case management to assist with outpatient home health physical therapy  Family Communication:   Pt at beside Disposition Plan:   Home when medically stable    Antibiotics:  Zosyn 12/03/2012>>>  IV Vanc 12/03/2012>> 12/05/2012  PO Vanc 12/03/2012>>>   Procedures/Studies: Dg Chest Port 1 View  12/05/2012   *RADIOLOGY REPORT*  Clinical Data: Pneumonia  PORTABLE CHEST - 1 VIEW  Comparison: 12/04/2012  Findings: Endotracheal tube has been removed in the interval. Right Port-A-Cath remains in place. The cardiopericardial silhouette is enlarged.  Retrocardiac collapse / consolidation with left pleural effusion persist.  There is residual airspace disease at the right base. Telemetry leads  overlie the chest.  IMPRESSION: Interval extubation and NG tube removal.  Cardiomegaly with left greater than right bibasilar collapse / consolidation.  There is minimal improvement in aeration at the right base.   Original Report Authenticated By: Kennith Center, M.D.   Dg Chest Port 1 View  12/04/2012   *RADIOLOGY REPORT*  Clinical Data: Shortness of breath.  Edema.  PORTABLE CHEST - 1 VIEW  Comparison: Chest 12/02/2012.  Findings: Support tubes and lines are unchanged.  Aeration in the right upper lung zone has markedly improved.  There is some increased airspace opacity in the right lung base likely due to a combination of effusion and airspace disease.  Left basilar opacity is not markedly changed.  No pneumothorax is identified.  Remote left rib fractures are identified.  IMPRESSION: Marked improvement in aeration in the right upper lung zone. Worsened aeration in the right lung base is likely due to a combination of increased pleural fluid and airspace disease.   Original Report Authenticated By: Holley Dexter, M.D.   Dg Chest Port 1 View  12/02/2012   *RADIOLOGY REPORT*  Clinical Data: Shortness of breath, intubated.  PORTABLE CHEST - 1 VIEW  Comparison: None  Findings: Endotracheal tube is 3.6 cm above the carina.  Patchy bilateral airspace opacities, most confluent throughout the right lung.  Findings concerning for pneumonia.  Heart is upper limits normal in size.  No visible effusions.  Right Port-A-Cath is in place with the tip at the cavoatrial junction.  No acute bony abnormality.  IMPRESSION: Endotracheal tube 3.6 cm above the carina.  Bilateral airspace opacities, right greater than left concerning for pneumonia.   Original Report Authenticated By: Charlett Nose, M.D.         Subjective: Patient is breathing 75% better. She denies any fevers, chills, chest pain, shortness breath, nausea, vomiting, diarrhea,  abdominal pain. She is having some loose stools.  Objective: Filed Vitals:    12/05/12 1632 12/05/12 2040 12/06/12 0423 12/06/12 1355  BP: 141/64 148/73 160/82 140/80  Pulse: 65 66 67 69  Temp: 98.5 F (36.9 C) 98.5 F (36.9 C) 98.3 F (36.8 C) 98 F (36.7 C)  TempSrc:  Oral Oral Oral  Resp: 18 18 18 19   Height:      Weight:   64.411 kg (142 lb)   SpO2: 100% 99% 96% 99%    Intake/Output Summary (Last 24 hours) at 12/06/12 1400 Last data filed at 12/06/12 1230  Gross per 24 hour  Intake 1292.67 ml  Output   1451 ml  Net -158.33 ml   Weight change: -1.589 kg (-3 lb 8.1 oz) Exam:   General:  Pt is alert, follows commands appropriately, not in acute distress  HEENT: No icterus, No thrush,  [place]/AT  Cardiovascular: RRR, S1/S2, no rubs, no gallops  Respiratory: Bibasilar crackles. No wheezes or rhonchi. Good air movement.  Abdomen: Soft/+BS, non tender, non distended, no guarding  Extremities: 1+ edema, No lymphangitis, No petechiae, No rashes, no synovitis  Data Reviewed: Basic Metabolic Panel:  Recent Labs Lab 12/03/12 0936 12/04/12 0400 12/04/12 1810 12/05/12 0430 12/06/12 0450  NA 138 141 139 136 139  K 3.7 2.9* 3.5 2.9* 3.3*  CL 105 103 101 99 102  CO2 28 29 28 30 30   GLUCOSE 90 80 102* 145* 77  BUN 12 12 12 11 8   CREATININE 1.00 1.01 1.06 0.96 0.84  CALCIUM 7.6* 7.6* 8.0* 7.9* 8.4  MG  --  1.2*  --   --  1.7  PHOS  --  3.9  --  3.4  --    Liver Function Tests:  Recent Labs Lab 12/02/12 2349 12/03/12 0936 12/04/12 0400 12/05/12 0430  AST 42* 51*  --   --   ALT 30 25  --   --   ALKPHOS 118* 86  --   --   BILITOT 0.4 0.4  --   --   PROT 6.1 4.8*  --   --   ALBUMIN 2.3* 1.8* 1.7* 1.7*    Recent Labs Lab 12/02/12 2349  LIPASE 8*   No results found for this basename: AMMONIA,  in the last 168 hours CBC:  Recent Labs Lab 12/02/12 2349 12/03/12 0936 12/04/12 0400 12/05/12 0430 12/06/12 0450  WBC 12.4* 5.3 3.7* 4.0 4.5  NEUTROABS 4.3  --   --   --   --   HGB 9.9* 7.9* 7.6* 7.4* 7.9*  HCT 31.2* 24.4* 23.4*  22.8* 24.4*  MCV 82.3 80.5 81.3 80.0 80.0  PLT 330 202 216 243 299   Cardiac Enzymes:  Recent Labs Lab 12/02/12 2349 12/03/12 1600 12/05/12 0430  TROPONINI <0.30 7.08* 2.08*   BNP: No components found with this basename: POCBNP,  CBG:  Recent Labs Lab 12/05/12 2038 12/05/12 2350 12/06/12 0424 12/06/12 0806 12/06/12 1230  GLUCAP 159* 88 87 113* 186*    Recent Results (from the past 240 hour(s))  URINE CULTURE     Status: None   Collection Time    12/03/12 12:30 AM      Result Value Range Status   Specimen Description URINE, CATHETERIZED   Final   Special Requests NONE   Final   Culture  Setup Time 12/03/2012 11:15   Final   Colony Count NO GROWTH   Final   Culture NO GROWTH   Final   Report  Status 12/04/2012 FINAL   Final  CULTURE, BLOOD (ROUTINE X 2)     Status: None   Collection Time    12/03/12  3:08 AM      Result Value Range Status   Specimen Description BLOOD RIGHT HAND   Final   Special Requests BOTTLES DRAWN AEROBIC ONLY 10CC   Final   Culture  Setup Time 12/03/2012 10:53   Final   Culture     Final   Value:        BLOOD CULTURE RECEIVED NO GROWTH TO DATE CULTURE WILL BE HELD FOR 5 DAYS BEFORE ISSUING A FINAL NEGATIVE REPORT   Report Status PENDING   Incomplete  CULTURE, BLOOD (ROUTINE X 2)     Status: None   Collection Time    12/03/12  3:15 AM      Result Value Range Status   Specimen Description BLOOD LEFT ARM   Final   Special Requests     Final   Value: BOTTLES DRAWN AEROBIC AND ANAEROBIC 10CC BLUE 5CC RED   Culture  Setup Time 12/03/2012 10:54   Final   Culture     Final   Value:        BLOOD CULTURE RECEIVED NO GROWTH TO DATE CULTURE WILL BE HELD FOR 5 DAYS BEFORE ISSUING A FINAL NEGATIVE REPORT   Report Status PENDING   Incomplete  MRSA PCR SCREENING     Status: Abnormal   Collection Time    12/03/12  4:00 AM      Result Value Range Status   MRSA by PCR POSITIVE (*) NEGATIVE Final   Comment:            The GeneXpert MRSA Assay (FDA      approved for NASAL specimens     only), is one component of a     comprehensive MRSA colonization     surveillance program. It is not     intended to diagnose MRSA     infection nor to guide or     monitor treatment for     MRSA infections.     RESULT CALLED TO, READ BACK BY AND VERIFIED WITH:     HAYES,C RN [phone] 12/03/12 MITCHELL,L  CULTURE, RESPIRATORY (NON-EXPECTORATED)     Status: None   Collection Time    12/03/12  7:33 AM      Result Value Range Status   Specimen Description TRACHEAL ASPIRATE   Final   Special Requests Normal   Final   Gram Stain     Final   Value: RARE WBC PRESENT, PREDOMINANTLY MONONUCLEAR     NO SQUAMOUS EPITHELIAL CELLS SEEN     NO ORGANISMS SEEN   Culture RARE CANDIDA ALBICANS   Final   Report Status 12/05/2012 FINAL   Final  CLOSTRIDIUM DIFFICILE BY PCR     Status: None   Collection Time    12/03/12 12:17 PM      Result Value Range Status   C difficile by pcr NEGATIVE  NEGATIVE Final     Scheduled Meds: . ARIPiprazole  5 mg Oral Daily  . aspirin  81 mg Oral Daily  . Chlorhexidine Gluconate Cloth  6 each Topical Q0600  . darifenacin  15 mg Oral Daily  . escitalopram  10 mg Oral Daily  . famotidine  20 mg Oral BID  . furosemide  40 mg Oral BID  . heparin  5,000 Units Subcutaneous Q8H  . insulin aspart  0-9 Units Subcutaneous TID  WC  . levothyroxine  112 mcg Per NG tube QAC breakfast  . lipase/protease/amylase  1 capsule Oral TID WC  . mupirocin ointment  1 application Nasal BID  . nebivolol  5 mg Oral Daily  . piperacillin-tazobactam (ZOSYN)  IV  3.375 g Intravenous Q8H  . potassium chloride  40 mEq Oral Daily  . potassium chloride  40 mEq Oral Once  . vancomycin  125 mg Per NG tube Q6H   Continuous Infusions:    Tonya Pistilli, DO  [facility] Pager [phone]  If 7PM-7AM, please contact night-coverage [email or web address] Password [email or web address] 12/06/2012, 2:00 PM   LOS: 4 days

## 2012-12-06 NOTE — Progress Notes (Signed)
Patient ID: Tonya Wallace, female   DOB: 1934/11/19, 78 y.o.   MRN: [id number]   SUBJECTIVE  The patient is in good spirits and feeling well today. There was mild diuresis yesterday. Her diuretics were changed from IV to oral Lasix yesterday. Potassium today is 3.3.   Filed Vitals:   12/05/12 1600 12/05/12 1632 12/05/12 2040 12/06/12 0423  BP: 151/68 141/64 148/73 160/82  Pulse: 65 65 66 67  Temp:  98.5 F (36.9 C) 98.5 F (36.9 C) 98.3 F (36.8 C)  TempSrc:   Oral Oral  Resp: 26 18 18 18   Height:      Weight:    142 lb (64.411 kg)  SpO2: 97% 100% 99% 96%    Intake/Output Summary (Last 24 hours) at 12/06/12 0848 Last data filed at 12/06/12 0538  Gross per 24 hour  Intake 942.67 ml  Output   1451 ml  Net -508.33 ml    LABS: Basic Metabolic Panel:  Recent Labs  78/29/56 0400  12/05/12 0430 12/06/12 0450  NA 141  < > 136 139  K 2.9*  < > 2.9* 3.3*  CL 103  < > 99 102  CO2 29  < > 30 30  GLUCOSE 80  < > 145* 77  BUN 12  < > 11 8  CREATININE 1.01  < > 0.96 0.84  CALCIUM 7.6*  < > 7.9* 8.4  MG 1.2*  --   --  1.7  PHOS 3.9  --  3.4  --   < > = values in this interval not displayed. Liver Function Tests:  Recent Labs  12/03/12 0936 12/04/12 0400 12/05/12 0430  AST 51*  --   --   ALT 25  --   --   ALKPHOS 86  --   --   BILITOT 0.4  --   --   PROT 4.8*  --   --   ALBUMIN 1.8* 1.7* 1.7*   No results found for this basename: LIPASE, AMYLASE,  in the last 72 hours CBC:  Recent Labs  12/05/12 0430 12/06/12 0450  WBC 4.0 4.5  HGB 7.4* 7.9*  HCT 22.8* 24.4*  MCV 80.0 80.0  PLT 243 299   Cardiac Enzymes:  Recent Labs  12/03/12 1600 12/05/12 0430  TROPONINI 7.08* 2.08*   BNP: No components found with this basename: POCBNP,  D-Dimer:  Recent Labs  12/03/12 2127  DDIMER 1.40*   Hemoglobin A1C: No results found for this basename: HGBA1C,  in the last 72 hours Fasting Lipid Panel: No results found for this basename: CHOL, HDL, LDLCALC, TRIG,  CHOLHDL, LDLDIRECT,  in the last 72 hours Thyroid Function Tests: No results found for this basename: TSH, T4TOTAL, FREET3, T3FREE, THYROIDAB,  in the last 72 hours  RADIOLOGY: Dg Chest Port 1 View  12/05/2012   *RADIOLOGY REPORT*  Clinical Data: Pneumonia  PORTABLE CHEST - 1 VIEW  Comparison: 12/04/2012  Findings: Endotracheal tube has been removed in the interval. Right Port-A-Cath remains in place. The cardiopericardial silhouette is enlarged.  Retrocardiac collapse / consolidation with left pleural effusion persist.  There is residual airspace disease at the right base. Telemetry leads overlie the chest.  IMPRESSION: Interval extubation and NG tube removal.  Cardiomegaly with left greater than right bibasilar collapse / consolidation.  There is minimal improvement in aeration at the right base.   Original Report Authenticated By: Kennith Center, M.D.   Dg Chest Port 1 View  12/04/2012   *RADIOLOGY REPORT*  Clinical  Data: Shortness of breath.  Edema.  PORTABLE CHEST - 1 VIEW  Comparison: Chest 12/02/2012.  Findings: Support tubes and lines are unchanged.  Aeration in the right upper lung zone has markedly improved.  There is some increased airspace opacity in the right lung base likely due to a combination of effusion and airspace disease.  Left basilar opacity is not markedly changed.  No pneumothorax is identified.  Remote left rib fractures are identified.  IMPRESSION: Marked improvement in aeration in the right upper lung zone. Worsened aeration in the right lung base is likely due to a combination of increased pleural fluid and airspace disease.   Original Report Authenticated By: Holley Dexter, M.D.   Dg Chest Port 1 View  12/02/2012   *RADIOLOGY REPORT*  Clinical Data: Shortness of breath, intubated.  PORTABLE CHEST - 1 VIEW  Comparison: None  Findings: Endotracheal tube is 3.6 cm above the carina.  Patchy bilateral airspace opacities, most confluent throughout the right lung.  Findings  concerning for pneumonia.  Heart is upper limits normal in size.  No visible effusions.  Right Port-A-Cath is in place with the tip at the cavoatrial junction.  No acute bony abnormality.  IMPRESSION: Endotracheal tube 3.6 cm above the carina.  Bilateral airspace opacities, right greater than left concerning for pneumonia.   Original Report Authenticated By: Charlett Nose, M.D.    PHYSICAL EXAM Patient is oriented to person time and place. Affect is normal. Lungs reveal scattered rhonchi. Cardiac exam reveals S1 and S2. There is no significant peripheral edema.   TELEMETRY: I have reviewed telemetry today Dec 06, 2012. There is sinus rhythm with a rate in the range of 65.   ASSESSMENT AND PLAN:    Pancreatic cancer   Diabetes mellitus   Hypothyroidism    Acute respiratory failure with hypoxia     This is improved and is being treated by pulmonary  Acute on chronic combined CHF    Patient volume status appears to be under good control at this time. She'll remain on oral Lasix today.    HCAP (healthcare-associated pneumonia)  Hypokalemia    She is receiving daily potassium. I'll give her one extra dose.  Tonya Wallace 12/06/2012 8:48 AM

## 2012-12-07 DIAGNOSIS — J9600 Acute respiratory failure, unspecified whether with hypoxia or hypercapnia: Principal | ICD-10-CM

## 2012-12-07 DIAGNOSIS — I509 Heart failure, unspecified: Secondary | ICD-10-CM

## 2012-12-07 DIAGNOSIS — I5031 Acute diastolic (congestive) heart failure: Secondary | ICD-10-CM

## 2012-12-07 LAB — BASIC METABOLIC PANEL
BUN: 6 mg/dL (ref 6–23)
CO2: 26 mEq/L (ref 19–32)
Calcium: 8.4 mg/dL (ref 8.4–10.5)
Creatinine, Ser: 0.8 mg/dL (ref 0.50–1.10)
GFR calc non Af Amer: 70 mL/min — ABNORMAL LOW (ref >90)
Glucose, Bld: 140 mg/dL — ABNORMAL HIGH (ref 70–99)

## 2012-12-07 MED ORDER — LEVOFLOXACIN 500 MG PO TABS: 500.0000 mg | ORAL_TABLET | Freq: Every day | ORAL | Status: DC

## 2012-12-07 MED ORDER — LEVOFLOXACIN 500 MG PO TABS
500.0000 mg | ORAL_TABLET | Freq: Every day | ORAL | Status: DC
  Administered 2012-12-07: 500 mg via ORAL
  Filled 2012-12-07: qty 1

## 2012-12-07 MED ORDER — FUROSEMIDE 40 MG PO TABS: 40.0000 mg | ORAL_TABLET | Freq: Two times a day (BID) | ORAL | Status: DC

## 2012-12-07 MED ORDER — ASPIRIN 81 MG PO CHEW: 81.0000 mg | CHEWABLE_TABLET | Freq: Every day | ORAL | Status: AC

## 2012-12-07 MED ORDER — HEPARIN SOD (PORK) LOCK FLUSH 100 UNIT/ML IV SOLN
500.0000 [IU] | Freq: Once | INTRAVENOUS | Status: AC
  Administered 2012-12-07: 500 [IU] via INTRAVENOUS
  Filled 2012-12-07: qty 5

## 2012-12-07 MED ORDER — POTASSIUM CHLORIDE CRYS ER 20 MEQ PO TBCR: 40.0000 meq | EXTENDED_RELEASE_TABLET | Freq: Every day | ORAL | Status: DC

## 2012-12-07 NOTE — Discharge Summary (Addendum)
Physician Discharge Summary  Tonya Wallace ZOX:[id number] DOB: November 11, 1934 DOA: 12/02/2012  PCP: Provider Not In System  Admit date: 12/02/2012 Discharge date: 12/07/2012  Recommendations for Outpatient Follow-up:  1. Pt will need to follow up with PCP in 2 weeks post discharge 2. Please obtain BMP to evaluate electrolytes and kidney function 3. Please also check CBC to evaluate Hg and Hct levels 4. Follow up with Cardiology, Dr. Myrtis Ser in 2 weeks; Cardiology to arrange followup  Discharge Diagnoses:  Active Problems:   Pancreatic cancer   Diabetes mellitus   Hypothyroidism   Acute respiratory failure with hypoxia   Acute pulmonary edema   HCAP (healthcare-associated pneumonia)   Acute on chronic combined systolic and diastolic CHF (congestive heart failure)   Hypokalemia Acute respiratory failure  -The patient was intubated in the emergency department and admitted to the critical care service -The patient was started on empiric broad-spectrum antibiotic therapy with vancomycin and Zosyn after blood cultures and urine cultures were obtained. -The patient was also treated for acute diastolic CHF with IV furosemide -Initial proBNP was 26,162 -Cardiology was consulted -The patient clinically improved and was extubated. She was moved out of the intensive care unit on 12/05/2012. -[facility] assumed care on 12/06/2012 -She was transitioned from IV to oral furosemide -The patient remained clinically stable and did not require any further oxygen supplementation  -Zosyn was the escalated to levofloxacin; she will require 2 additional days of levofloxacin home to complete a seven-day course of antibiotics  -Likely due to a combination of HCAP and pulm edema  -Fever pulmonary edema given rapid improvement, the patient did have lactic acidosis at the time of admission, 6.22 and elevated procalcitonin 0.93  -Status post extubation  -Remained stable on room air  Acute diastolic CHF   -12/03/2012 echo EF 50-55%, grade 3 diastolic dysfunction, mild to moderate MR  -Appreciate cardiology followup  -Continue oral furosemide  --4884 cc urine output for the admission  -Continue beta blockade  -Tekturna was d/ced--she needs to f/u with her PCP and cardiology for future adjustment of her antiHTN regimen HCAP  -Continue Zosyn which was started on 12/03/2012  -If remains clinically stable, plan to switch to oral antibiotics on May 16  Hypokalemia  -Replete  -Check magnesium--1.6 History of Clostridium difficile colitis  -Diagnosed when she was at [facility]  -Continue vancomycin solution PO  -C. difficile PCR negative during this admission  -The patient was instructed to finish her course of vancomycin solution that was originally prescribed by her physicians at [facility]  Pancreatic cancer  -Biliary drain to stay capped  -Followup with Dr. Lavella Lemons at [facility] after d/c  -Last chemotherapy 3 weeks ago  Hyperglycemia/Diabetes mellitus, type 2--controlled -Check hemoglobin A1c--6.9 -Due to the patient's age and pancreatic cancer, it was decided that it was not in the patient's best interest for tight glycemic control -As a result, the patient will not be started on any oral hypoglycemic agents -She will need followup with her primary care physician - NovoLog sliding scale while she was in hospital Deconditioning  -Continue PT  -Consult case management to assist with outpatient home health physical therapy   Disposition Plan: Home  Antibiotics:  Levofloxacin 12/07/2012>>>  Zosyn 12/03/2012>>> 12/07/12 IV Vanc 12/03/2012>> 12/05/2012  PO Vanc 12/03/2012>>>   Discharge Condition: Stable  Disposition:  discharge home  Diet: Heart healthy Wt Readings from Last 3 Encounters:  12/07/12 63.1 kg (139 lb 1.8 oz)  12/07/12 63.1 kg (139 lb 1.8 oz)  History of present illness:  78 years old female with PMH relevant for pancreatic cancer, DM, hypothyroidism and  depression. Presents with sudden onset of SOB. Famile denies any cough, sputum production or fever. At arrival to the ED profoundly hypoxemic requiring intubation. Chest X ray with bilateral infiltrates suggestive of multifocal pneumonia vs CHF. BNP [id number]. At the time of critical care evaluation, the patient is intubated, sedated, hemodynamically stable. Got Lasix 80 mg IV in the ED.     Consultants: [facility] Cardiology, Dr. Myrtis Ser  Discharge Exam: Filed Vitals:   12/07/12 0327  BP: 168/76  Pulse: 62  Temp: 97.9 F (36.6 C)  Resp: 18   Filed Vitals:   12/06/12 1355 12/06/12 1952 12/07/12 0327 12/07/12 0745  BP: 140/80 131/101 168/76   Pulse: 69 63 62   Temp: 98 F (36.7 C) 98.4 F (36.9 C) 97.9 F (36.6 C)   TempSrc: Oral Oral Oral   Resp: 19 18 18    Height:      Weight:   63.1 kg (139 lb 1.8 oz)   SpO2: 99% 99% 99% 92%   General: A&O x 3, NAD, pleasant, cooperative Cardiovascular: RRR, no rub, no gallop, no S3 Respiratory: Bibasilar crackles without any wheezing or rhonchi. Good air movement. Abdomen:soft, nontender, nondistended, positive bowel sounds Extremities: 1+ edema, No lymphangitis, no petechiae  Discharge Instructions  Discharge Orders   Future Orders Complete By Expires     Diet - low sodium heart healthy  As directed     Discharge instructions  As directed     Comments:      Finish your oral vancomycin medication that you were previously prescribed by [facility]    Increase activity slowly  As directed         Medication List    STOP taking these medications       aliskiren 300 MG tablet  Commonly known as:  TEKTURNA     ciprofloxacin 500 MG tablet  Commonly known as:  CIPRO      TAKE these medications       ARIPiprazole 5 MG tablet  Commonly known as:  ABILIFY  Take 5 mg by mouth daily.     aspirin 81 MG chewable tablet  Chew 1 tablet (81 mg total) by mouth daily.     clonazePAM 0.5 MG tablet  Commonly known as:  KLONOPIN  Take 0.5  mg by mouth 2 (two) times daily as needed for anxiety.     diphenoxylate-atropine 2.5-0.025 MG per tablet  Commonly known as:  LOMOTIL  Take 1 tablet by mouth 4 (four) times daily as needed for diarrhea or loose stools.     escitalopram 10 MG tablet  Commonly known as:  LEXAPRO  Take 10 mg by mouth daily.     furosemide 40 MG tablet  Commonly known as:  LASIX  Take 1 tablet (40 mg total) by mouth 2 (two) times daily.     levofloxacin 500 MG tablet  Commonly known as:  LEVAQUIN  Take 1 tablet (500 mg total) by mouth daily.     levothyroxine 112 MCG tablet  Commonly known as:  SYNTHROID, LEVOTHROID  Take 112 mcg by mouth daily before breakfast.     lidocaine-prilocaine cream  Commonly known as:  EMLA  Apply 1 application topically as needed (Apply 30 gm 45 minutes before port).     lipase/protease/amylase 16109 UNITS Cpep  Commonly known as:  CREON-12/PANCREASE  Take 1 capsule by mouth 3 (three) times daily  with meals.     magic mouthwash Soln  Take 15 mLs by mouth 4 (four) times daily.     multivitamin with minerals Tabs  Take 1 tablet by mouth daily.     nebivolol 5 MG tablet  Commonly known as:  BYSTOLIC  Take 5 mg by mouth daily.     ondansetron 8 MG tablet  Commonly known as:  ZOFRAN  Take by mouth every 8 (eight) hours as needed for nausea.     potassium chloride SA 20 MEQ tablet  Commonly known as:  K-DUR,KLOR-CON  Take 2 tablets (40 mEq total) by mouth daily.     solifenacin 10 MG tablet  Commonly known as:  VESICARE  Take 10 mg by mouth daily.     traMADol 50 MG tablet  Commonly known as:  ULTRAM  Take 50 mg by mouth every 6 (six) hours as needed for pain.     Vancomycin HCl 25 MG/ML Soln  Take 125 mg by mouth every 6 (six) hours. For 21 days     zolpidem 6.25 MG CR tablet  Commonly known as:  AMBIEN CR  Take 6.25 mg by mouth at bedtime as needed for sleep.         The results of significant diagnostics from this hospitalization (including  imaging, microbiology, ancillary and laboratory) are listed below for reference.    Significant Diagnostic Studies: Dg Chest Port 1 View  12/05/2012   *RADIOLOGY REPORT*  Clinical Data: Pneumonia  PORTABLE CHEST - 1 VIEW  Comparison: 12/04/2012  Findings: Endotracheal tube has been removed in the interval. Right Port-A-Cath remains in place. The cardiopericardial silhouette is enlarged.  Retrocardiac collapse / consolidation with left pleural effusion persist.  There is residual airspace disease at the right base. Telemetry leads overlie the chest.  IMPRESSION: Interval extubation and NG tube removal.  Cardiomegaly with left greater than right bibasilar collapse / consolidation.  There is minimal improvement in aeration at the right base.   Original Report Authenticated By: Kennith Center, M.D.   Dg Chest Port 1 View  12/04/2012   *RADIOLOGY REPORT*  Clinical Data: Shortness of breath.  Edema.  PORTABLE CHEST - 1 VIEW  Comparison: Chest 12/02/2012.  Findings: Support tubes and lines are unchanged.  Aeration in the right upper lung zone has markedly improved.  There is some increased airspace opacity in the right lung base likely due to a combination of effusion and airspace disease.  Left basilar opacity is not markedly changed.  No pneumothorax is identified.  Remote left rib fractures are identified.  IMPRESSION: Marked improvement in aeration in the right upper lung zone. Worsened aeration in the right lung base is likely due to a combination of increased pleural fluid and airspace disease.   Original Report Authenticated By: Holley Dexter, M.D.   Dg Chest Port 1 View  12/02/2012   *RADIOLOGY REPORT*  Clinical Data: Shortness of breath, intubated.  PORTABLE CHEST - 1 VIEW  Comparison: None  Findings: Endotracheal tube is 3.6 cm above the carina.  Patchy bilateral airspace opacities, most confluent throughout the right lung.  Findings concerning for pneumonia.  Heart is upper limits normal in size.  No  visible effusions.  Right Port-A-Cath is in place with the tip at the cavoatrial junction.  No acute bony abnormality.  IMPRESSION: Endotracheal tube 3.6 cm above the carina.  Bilateral airspace opacities, right greater than left concerning for pneumonia.   Original Report Authenticated By: Charlett Nose, M.D.  Microbiology: Recent Results (from the past 240 hour(s))  URINE CULTURE     Status: None   Collection Time    12/03/12 12:30 AM      Result Value Range Status   Specimen Description URINE, CATHETERIZED   Final   Special Requests NONE   Final   Culture  Setup Time 12/03/2012 11:15   Final   Colony Count NO GROWTH   Final   Culture NO GROWTH   Final   Report Status 12/04/2012 FINAL   Final  CULTURE, BLOOD (ROUTINE X 2)     Status: None   Collection Time    12/03/12  3:08 AM      Result Value Range Status   Specimen Description BLOOD RIGHT HAND   Final   Special Requests BOTTLES DRAWN AEROBIC ONLY 10CC   Final   Culture  Setup Time 12/03/2012 10:53   Final   Culture     Final   Value:        BLOOD CULTURE RECEIVED NO GROWTH TO DATE CULTURE WILL BE HELD FOR 5 DAYS BEFORE ISSUING A FINAL NEGATIVE REPORT   Report Status PENDING   Incomplete  CULTURE, BLOOD (ROUTINE X 2)     Status: None   Collection Time    12/03/12  3:15 AM      Result Value Range Status   Specimen Description BLOOD LEFT ARM   Final   Special Requests     Final   Value: BOTTLES DRAWN AEROBIC AND ANAEROBIC 10CC BLUE 5CC RED   Culture  Setup Time 12/03/2012 10:54   Final   Culture     Final   Value:        BLOOD CULTURE RECEIVED NO GROWTH TO DATE CULTURE WILL BE HELD FOR 5 DAYS BEFORE ISSUING A FINAL NEGATIVE REPORT   Report Status PENDING   Incomplete  MRSA PCR SCREENING     Status: Abnormal   Collection Time    12/03/12  4:00 AM      Result Value Range Status   MRSA by PCR POSITIVE (*) NEGATIVE Final   Comment:            The GeneXpert MRSA Assay (FDA     approved for NASAL specimens     only), is one  component of a     comprehensive MRSA colonization     surveillance program. It is not     intended to diagnose MRSA     infection nor to guide or     monitor treatment for     MRSA infections.     RESULT CALLED TO, READ BACK BY AND VERIFIED WITH:     HAYES,C RN [phone] 12/03/12 MITCHELL,L  CULTURE, RESPIRATORY (NON-EXPECTORATED)     Status: None   Collection Time    12/03/12  7:33 AM      Result Value Range Status   Specimen Description TRACHEAL ASPIRATE   Final   Special Requests Normal   Final   Gram Stain     Final   Value: RARE WBC PRESENT, PREDOMINANTLY MONONUCLEAR     NO SQUAMOUS EPITHELIAL CELLS SEEN     NO ORGANISMS SEEN   Culture RARE CANDIDA ALBICANS   Final   Report Status 12/05/2012 FINAL   Final  CLOSTRIDIUM DIFFICILE BY PCR     Status: None   Collection Time    12/03/12 12:17 PM      Result Value Range Status   C difficile by pcr  NEGATIVE  NEGATIVE Final     Labs: Basic Metabolic Panel:  Recent Labs Lab 12/03/12 0936 12/04/12 0400 12/04/12 1810 12/05/12 0430 12/06/12 0450 12/07/12 0500  NA 138 141 139 136 139 137  K 3.7 2.9* 3.5 2.9* 3.3* 4.1  CL 105 103 101 99 102 100  CO2 28 29 28 30 30 26   GLUCOSE 90 80 102* 145* 77 140*  BUN 12 12 12 11 8 6   CREATININE 1.00 1.01 1.06 0.96 0.84 0.80  CALCIUM 7.6* 7.6* 8.0* 7.9* 8.4 8.4  MG  --  1.2*  --   --  1.7 1.6  PHOS  --  3.9  --  3.4  --   --    Liver Function Tests:  Recent Labs Lab 12/02/12 2349 12/03/12 0936 12/04/12 0400 12/05/12 0430  AST 42* 51*  --   --   ALT 30 25  --   --   ALKPHOS 118* 86  --   --   BILITOT 0.4 0.4  --   --   PROT 6.1 4.8*  --   --   ALBUMIN 2.3* 1.8* 1.7* 1.7*    Recent Labs Lab 12/02/12 2349  LIPASE 8*   No results found for this basename: AMMONIA,  in the last 168 hours CBC:  Recent Labs Lab 12/02/12 2349 12/03/12 0936 12/04/12 0400 12/05/12 0430 12/06/12 0450  WBC 12.4* 5.3 3.7* 4.0 4.5  NEUTROABS 4.3  --   --   --   --   HGB 9.9* 7.9* 7.6* 7.4* 7.9*   HCT 31.2* 24.4* 23.4* 22.8* 24.4*  MCV 82.3 80.5 81.3 80.0 80.0  PLT 330 202 216 243 299   Cardiac Enzymes:  Recent Labs Lab 12/02/12 2349 12/03/12 1600 12/05/12 0430  TROPONINI <0.30 7.08* 2.08*   BNP: No components found with this basename: POCBNP,  CBG:  Recent Labs Lab 12/06/12 0806 12/06/12 1230 12/06/12 1612 12/06/12 2024 12/07/12 0615  GLUCAP 113* 186* 188* 136* 120*    Time coordinating discharge:  Greater than 30 minutes  Signed:  Nagee Goates, DO [facility] Pager: [phone] 12/07/2012, 10:10 AM

## 2012-12-07 NOTE — Progress Notes (Signed)
Patient ID: Tonya Wallace, female   DOB: 12/03/1934, 78 y.o.   MRN: [id number]   Nothing to add today.  Jerral Bonito, MD

## 2012-12-07 NOTE — Progress Notes (Signed)
Pt discharged home via family; Pt and family given and explained all discharge instructions, carenotes, and prescriptions; pt and family stated understanding and denied questions/concerns; all f/u appointments in place; IV removed without complicaitons; pt stable at time of discharge; home health set up; PORT deaccessed

## 2012-12-07 NOTE — Clinical Documentation Improvement (Signed)
DIABETIC  DOCUMENTATION CLARIFICATION QUERY  THIS DOCUMENT IS NOT A PERMANENT PART OF THE MEDICAL RECORD  12/07/12  Dear Dr. Onalee Hua Tat,   In a better effort to capture your patient's severity of illness, reflect appropriate length of stay and utilization of resources, a review of the patient medical record has revealed the following indicators.   Based on your clinical judgment, please clarify and document in a progress note and/or discharge summary the clinical condition associated with the following supporting information:  In the initial H&P on 12/03/12, it was documented that the patient has a diagnosis of DM, with no specification of the type. This has been carried over to following progress notes.   In responding to this query please exercise your independent judgment. The fact that a query is asked, does not imply that any particular answer is desired or expected.   Please clarify and specify Diabetes type, control, manifestations, and associated conditions.   Possible Clinical Conditions?  _______Diabetes Type 1 or 2  _______Controlled or uncontrolled  Manifestations:  _______DM retinopathy  _______DM PVD  _______DM neuropathy  _______DM nephropathy  Associated conditions:  _______DM cellulitis  _______DM gangrene  _______DM gastroparesis  _______DM hyperosmolarity state  _______DM ketoacidosis with or without coma  _______DM osteomyelitis  _______DM skin ulcer  _______Other Condition  _______Cannot Clinically determine   Supporting Information:  Patient was not on any antidiabetic agents at home prior to admission.   Lab:  Hgb A1c level: 6.9% (done 12/06/12)  Blood glucose range:  Glucose on admission was 286, and patient was put on sliding scale insulin.  Since then, the capillary glucose has ranged from 71-195.   You may use possible, probable, or suspect with inpatient documentation.  Possible, probable, suspected diagnoses MUST be documented at the time of  discharge.     Reviewed: DM2   Thank You,  Darla Lesches, RN, BSN, CCRN Clinical Documentation Specialist Pager [phone]  Office [phone]  HIM department [facility]

## 2013-01-02 LAB — URINE CULTURE

## 2013-01-02 LAB — IRON AND TIBC
Iron: 25 ug/dL — ABNORMAL LOW (ref 42–135)
Saturation Ratios: 13 % — ABNORMAL LOW (ref 20–55)
TIBC: 188 ug/dL — ABNORMAL LOW (ref 250–470)
UIBC: 163 ug/dL (ref 125–400)

## 2013-01-02 LAB — CULTURE, BLOOD (ROUTINE X 2)

## 2013-01-02 LAB — CULTURE, RESPIRATORY W GRAM STAIN

## 2013-01-02 LAB — CORTISOL: Cortisol, Plasma: 15.7 ug/dL

## 2013-01-02 LAB — VITAMIN B12: Vitamin B-12: 864 pg/mL (ref 211–911)

## 2013-03-29 ENCOUNTER — Encounter (HOSPITAL_COMMUNITY): Payer: Self-pay | Admitting: Emergency Medicine

## 2013-03-29 ENCOUNTER — Emergency Department (HOSPITAL_COMMUNITY)
Admission: EM | Admit: 2013-03-29 | Discharge: 2013-03-29 | Disposition: A | Discharge status: Home / Self Care | Payer: Medicare Other | Attending: Emergency Medicine | Admitting: Emergency Medicine

## 2013-03-29 ENCOUNTER — Other Ambulatory Visit: Payer: Self-pay

## 2013-03-29 ENCOUNTER — Emergency Department (HOSPITAL_COMMUNITY): Payer: Medicare Other

## 2013-03-29 DIAGNOSIS — E119 Type 2 diabetes mellitus without complications: Secondary | ICD-10-CM | POA: Insufficient documentation

## 2013-03-29 DIAGNOSIS — Z862 Personal history of diseases of the blood and blood-forming organs and certain disorders involving the immune mechanism: Secondary | ICD-10-CM | POA: Insufficient documentation

## 2013-03-29 DIAGNOSIS — F329 Major depressive disorder, single episode, unspecified: Secondary | ICD-10-CM | POA: Insufficient documentation

## 2013-03-29 DIAGNOSIS — I10 Essential (primary) hypertension: Secondary | ICD-10-CM | POA: Insufficient documentation

## 2013-03-29 DIAGNOSIS — Z7982 Long term (current) use of aspirin: Secondary | ICD-10-CM | POA: Insufficient documentation

## 2013-03-29 DIAGNOSIS — R197 Diarrhea, unspecified: Secondary | ICD-10-CM | POA: Insufficient documentation

## 2013-03-29 DIAGNOSIS — Z8619 Personal history of other infectious and parasitic diseases: Secondary | ICD-10-CM | POA: Insufficient documentation

## 2013-03-29 DIAGNOSIS — E039 Hypothyroidism, unspecified: Secondary | ICD-10-CM | POA: Insufficient documentation

## 2013-03-29 DIAGNOSIS — Z9221 Personal history of antineoplastic chemotherapy: Secondary | ICD-10-CM | POA: Insufficient documentation

## 2013-03-29 DIAGNOSIS — Z8509 Personal history of malignant neoplasm of other digestive organs: Secondary | ICD-10-CM | POA: Insufficient documentation

## 2013-03-29 DIAGNOSIS — M7989 Other specified soft tissue disorders: Secondary | ICD-10-CM | POA: Insufficient documentation

## 2013-03-29 DIAGNOSIS — Z79899 Other long term (current) drug therapy: Secondary | ICD-10-CM | POA: Insufficient documentation

## 2013-03-29 DIAGNOSIS — F3289 Other specified depressive episodes: Secondary | ICD-10-CM | POA: Insufficient documentation

## 2013-03-29 DIAGNOSIS — F411 Generalized anxiety disorder: Secondary | ICD-10-CM | POA: Insufficient documentation

## 2013-03-29 LAB — CBC WITH DIFFERENTIAL/PLATELET
Basophils Absolute: 0.1 10*3/uL (ref 0.0–0.1)
Lymphocytes Relative: 35 % (ref 12–46)
Lymphs Abs: 1.5 10*3/uL (ref 0.7–4.0)
Monocytes Relative: 11 % (ref 3–12)
Platelets: 215 10*3/uL (ref 150–400)
RDW: 17.3 % — ABNORMAL HIGH (ref 11.5–15.5)
WBC: 4.3 10*3/uL (ref 4.0–10.5)

## 2013-03-29 LAB — COMPREHENSIVE METABOLIC PANEL
AST: 17 U/L (ref 0–37)
Albumin: 3 g/dL — ABNORMAL LOW (ref 3.5–5.2)
Chloride: 99 mEq/L (ref 96–112)
Creatinine, Ser: 0.92 mg/dL (ref 0.50–1.10)
Potassium: 4.1 mEq/L (ref 3.5–5.1)
Total Bilirubin: 0.2 mg/dL — ABNORMAL LOW (ref 0.3–1.2)
Total Protein: 6 g/dL (ref 6.0–8.3)

## 2013-03-29 LAB — TROPONIN I: Troponin I: <0.3 ng/mL (ref <0.30)

## 2013-03-29 NOTE — ED Notes (Signed)
Patient comes to the ED with C/O bilateral lower leg swelling.  States that it has been ongoing for 2 weeks.  States that she is currently receiving chemotherapy for pancreatic cancer.  States that she did not receive her chemotherapy yesterday because her legs were swollen.  BLE are swollen and red. Also C/O having diarrhea for 5 days

## 2013-03-29 NOTE — ED Notes (Addendum)
Bilateral leg swelling left started first x 1 week and then rt got app w/  Cards dr  Shanon Rosser see her for a week so she came to er has also had diarrhea x 5 days also

## 2013-03-29 NOTE — Progress Notes (Signed)
*  PRELIMINARY RESULTS* Vascular Ultrasound Lower extremity venous duplex has been completed.  Preliminary findings: no evidence of DVT or baker's cyst bilaterally. Calf veins were not clearly visualized due to edema.   Farrel Demark, RDMS, RVT  03/29/2013, 1:58 PM

## 2013-03-29 NOTE — ED Provider Notes (Addendum)
CSN: [id number]     Arrival date & time 03/29/13  4098 History   First MD Initiated Contact with Patient 03/29/13 1124     Chief Complaint  Patient presents with  . Leg Swelling  . Diarrhea   (Consider location/radiation/quality/duration/timing/severity/associated sxs/prior Treatment) Patient is a 78 y.o. female presenting with diarrhea. The history is provided by the patient.  Diarrhea Associated symptoms: no headaches and no vomiting    patient followed by hematology oncology in [place]. Patient's other physicians including her cardiologist are in [place]. 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 Patient has been on chemotherapy. Patient started developing a leg swelling left first and then right. Seen by hematology oncology yesterday etiology of the leg swelling was not clear. A week ago when patient just had trigger less leg swelling Doppler study that was done that was negative for DVT. Patient denies any chest pain or shortness of breath. Patient does not feel badly on the chemotherapy for the pancreatic cancer. It is known to have spread to her liver. Patient also with diarrhea for several days hematology oncology is done stool cultures and C. difficile check results of that are pending. As stated no chest pain no shortness of breath  Past Medical History  Diagnosis Date  . Pancreatic adenoma   . Pancreatic cancer 12/03/2012  . Diabetes mellitus 12/03/2012  . Hypothyroidism 12/03/2012  . Clostridium difficile enterocolitis   . Depression   . Cardiomyopathy     a. 10/2012 Echo: EF 40-45%, basal inf, inflat, infsept HK, aortic sclerosis, mildly dil LA, mod MR, mild TR, mod PAH;  b. 11/2012 Echo: EF 50-55%, no rwma, gr 3 DD, Ao sclerosis, Mild MVP w mild to mod MR, mildly dil LA.  Marland Kitchen Hypertension   . Anxiety   . Iron deficiency anemia     a. h/o transfusion, most recently 10/2012 - [facility]   Past Surgical History  Procedure Laterality Date  . Gastric bypass    .  Abdominal hysterectomy    . Back surgery    . Cervical fusion    . Right total knee replacement    . Cholecystectomy    . Appendectomy    . Bilateral cataract surgeries     No family history on file. History  Substance Use Topics  . Smoking status: Never Smoker   . Smokeless tobacco: Not on file  . Alcohol Use: No   OB History   Grav Para Term Preterm Abortions TAB SAB Ect Mult Living                 Review of Systems  Constitutional: Negative for fatigue.  HENT: Negative for congestion and neck pain.   Eyes: Negative for redness.  Respiratory: Negative for shortness of breath.   Cardiovascular: Negative for chest pain.  Gastrointestinal: Positive for diarrhea. Negative for nausea and vomiting.  Genitourinary: Negative for dysuria.  Musculoskeletal: Negative for back pain.  Skin: Negative for rash.  Neurological: Negative for headaches.  Hematological: Does not bruise/bleed easily.  Psychiatric/Behavioral: Negative for confusion.    Allergies  Fentanyl  Home Medications   Current Outpatient Rx  Name  Route  Sig  Dispense  Refill  . Alum & Mag Hydroxide-Simeth (MAGIC MOUTHWASH) SOLN   Oral   Take 15 mLs by mouth 4 (four) times daily.         . ARIPiprazole (ABILIFY) 5 MG tablet   Oral   Take 5 mg by mouth daily.         Marland Kitchen  aspirin 81 MG chewable tablet   Oral   Chew 1 tablet (81 mg total) by mouth daily.         . clonazePAM (KLONOPIN) 0.5 MG tablet   Oral   Take 0.5 mg by mouth 2 (two) times daily as needed for anxiety.         . diphenoxylate-atropine (LOMOTIL) 2.5-0.025 MG per tablet   Oral   Take 1 tablet by mouth 4 (four) times daily as needed for diarrhea or loose stools.         Marland Kitchen escitalopram (LEXAPRO) 10 MG tablet   Oral   Take 10 mg by mouth daily.         . furosemide (LASIX) 40 MG tablet   Oral   Take 1 tablet (40 mg total) by mouth 2 (two) times daily.   60 tablet   0   . levothyroxine (SYNTHROID, LEVOTHROID) 112 MCG  tablet   Oral   Take 112 mcg by mouth daily before breakfast.         . lidocaine-prilocaine (EMLA) cream   Topical   Apply 1 application topically as needed (Apply 30 gm 45 minutes before port).         . lipase/protease/amylase (CREON-12/PANCREASE) 12000 UNITS CPEP   Oral   Take 1 capsule by mouth 3 (three) times daily with meals.         . Multiple Vitamin (MULTIVITAMIN WITH MINERALS) TABS   Oral   Take 1 tablet by mouth daily.         . nebivolol (BYSTOLIC) 5 MG tablet   Oral   Take 5 mg by mouth daily.         . potassium chloride SA (K-DUR,KLOR-CON) 20 MEQ tablet   Oral   Take 2 tablets (40 mEq total) by mouth daily.   30 tablet   0   . solifenacin (VESICARE) 10 MG tablet   Oral   Take 10 mg by mouth daily.         Marland Kitchen zolpidem (AMBIEN) 10 MG tablet   Oral   Take 10 mg by mouth at bedtime.         . ondansetron (ZOFRAN) 8 MG tablet   Oral   Take by mouth every 8 (eight) hours as needed for nausea.         . traMADol (ULTRAM) 50 MG tablet   Oral   Take 50 mg by mouth every 6 (six) hours as needed for pain.          BP 161/63  Pulse 85  Temp(Src) 98.6 F (37 C)  Resp 18  SpO2 99% Physical Exam  Nursing note and vitals reviewed. Constitutional: She is oriented to person, place, and time. She appears well-developed and well-nourished. No distress.  HENT:  Head: Normocephalic and atraumatic.  Mouth/Throat: Oropharynx is clear and moist.  Eyes: Conjunctivae and EOM are normal. Pupils are equal, round, and reactive to light.  Neck: Normal range of motion. Neck supple.  Cardiovascular: Normal rate, regular rhythm and normal heart sounds.   Pulmonary/Chest: Effort normal and breath sounds normal. No respiratory distress. She has no wheezes. She has no rales.  Abdominal: Soft. Bowel sounds are normal. She exhibits no distension. There is no tenderness.  Musculoskeletal: Normal range of motion. She exhibits edema. She exhibits no tenderness.   Bilateral leg swelling left greater than right. Cap refill 2 seconds both feet.  Neurological: She is alert and oriented to person, place, and time.  No cranial nerve deficit. She exhibits normal muscle tone. Coordination normal.  Skin: Skin is warm. No rash noted.    ED Course  Procedures (including critical care time) Labs Review Labs Reviewed  CBC WITH DIFFERENTIAL - Abnormal; Notable for the following:    RBC 3.32 (*)    Hemoglobin 9.3 (*)    HCT 27.5 (*)    RDW 17.3 (*)    Eosinophils Relative 7 (*)    Basophils Relative 2 (*)    All other components within normal limits  COMPREHENSIVE METABOLIC PANEL - Abnormal; Notable for the following:    Glucose, Bld 157 (*)    Albumin 3.0 (*)    Total Bilirubin 0.2 (*)    GFR calc non Af Amer 59 (*)    GFR calc Af Amer 68 (*)    All other components within normal limits  LIPASE, BLOOD - Abnormal; Notable for the following:    Lipase 7 (*)    All other components within normal limits  PRO B NATRIURETIC PEPTIDE - Abnormal; Notable for the following:    Pro B Natriuretic peptide (BNP) 1519.0 (*)    All other components within normal limits  TROPONIN I  URINALYSIS, ROUTINE W REFLEX MICROSCOPIC   Results for orders placed during the hospital encounter of 03/29/13  CBC WITH DIFFERENTIAL      Result Value Range   WBC 4.3  4.0 - 10.5 K/uL   RBC 3.32 (*) 3.87 - 5.11 MIL/uL   Hemoglobin 9.3 (*) 12.0 - 15.0 g/dL   HCT 16.1 (*) 09.6 - 04.5 %   MCV 82.8  78.0 - 100.0 fL   MCH 28.0  26.0 - 34.0 pg   MCHC 33.8  30.0 - 36.0 g/dL   RDW 40.9 (*) 81.1 - 91.4 %   Platelets 215  150 - 400 K/uL   Neutrophils Relative % 45  43 - 77 %   Lymphocytes Relative 35  12 - 46 %   Monocytes Relative 11  3 - 12 %   Eosinophils Relative 7 (*) 0 - 5 %   Basophils Relative 2 (*) 0 - 1 %   Neutro Abs 1.9  1.7 - 7.7 K/uL   Lymphs Abs 1.5  0.7 - 4.0 K/uL   Monocytes Absolute 0.5  0.1 - 1.0 K/uL   Eosinophils Absolute 0.3  0.0 - 0.7 K/uL   Basophils  Absolute 0.1  0.0 - 0.1 K/uL   RBC Morphology POLYCHROMASIA PRESENT    COMPREHENSIVE METABOLIC PANEL      Result Value Range   Sodium 135  135 - 145 mEq/L   Potassium 4.1  3.5 - 5.1 mEq/L   Chloride 99  96 - 112 mEq/L   CO2 26  19 - 32 mEq/L   Glucose, Bld 157 (*) 70 - 99 mg/dL   BUN 13  6 - 23 mg/dL   Creatinine, Ser 7.82  0.50 - 1.10 mg/dL   Calcium 8.7  8.4 - 95.6 mg/dL   Total Protein 6.0  6.0 - 8.3 g/dL   Albumin 3.0 (*) 3.5 - 5.2 g/dL   AST 17  0 - 37 U/L   ALT 17  0 - 35 U/L   Alkaline Phosphatase 84  39 - 117 U/L   Total Bilirubin 0.2 (*) 0.3 - 1.2 mg/dL   GFR calc non Af Amer 59 (*) >90 mL/min   GFR calc Af Amer 68 (*) >90 mL/min  LIPASE, BLOOD      Result  Value Range   Lipase 7 (*) 11 - 59 U/L  TROPONIN I      Result Value Range   Troponin I <0.30  <0.30 ng/mL  PRO B NATRIURETIC PEPTIDE      Result Value Range   Pro B Natriuretic peptide (BNP) 1519.0 (*) 0 - 450 pg/mL    Imaging Review Dg Chest 2 View  03/29/2013   *RADIOLOGY REPORT*  Clinical Data: leg swelling  CHEST - 2 VIEW  Comparison: 12/05/2012  Findings: The lungs are clear without focal infiltrate, edema, pneumothorax or pleural effusion.  Aeration at the bases is markedly improved in the interval.  There is some linear scarring or atelectasis at the left base. The cardiopericardial silhouette is enlarged.  Right-sided Port-A-Cath remains in place with tip overlying the distal SVC level.  The patient is status post multilevel thoracolumbar vertebral augmentation.  IMPRESSION: Cardiomegaly with linear atelectasis or scarring at the left base.   Original Report Authenticated By: Kennith Center, M.D.    Date: 03/29/2013  Rate: 64  Rhythm: normal sinus rhythm  QRS Axis: normal  Intervals: normal  ST/T Wave abnormalities: nonspecific ST/T changes  Conduction Disutrbances:none  Narrative Interpretation:   Old EKG Reviewed: unchanged No sniffing change in EKG compared to 12/05/2012   MDM   1. Leg swelling    2. Diarrhea    Workup without any explanation for the bilateral leg swelling. Doppler study shows no evidence of deep pain thrombosis. Renal function is normal chest x-ray not consistent with congestive heart failure or pulmonary edema. Followup with cardiology will be important to echocardiogram may be needed. Sforza diarrhea goes is sounds as if your doctors in [place] approaching that appropriately. Labs today without any significant abnormalities. Return for any new worse symptoms. Patient reports that he did do a C. difficile titer and stool cultures yesterday.   Shelda Jakes, MD 03/29/13 1417  Shelda Jakes, MD 03/29/13 1420

## 2013-07-19 ENCOUNTER — Encounter (HOSPITAL_COMMUNITY): Payer: Self-pay | Admitting: Emergency Medicine

## 2013-07-19 ENCOUNTER — Inpatient Hospital Stay (HOSPITAL_COMMUNITY)
Admission: EM | Admit: 2013-07-19 | Discharge: 2013-07-23 | DRG: 947 | Disposition: A | Discharge status: Hospice | Payer: Medicare Other | Attending: Internal Medicine | Admitting: Internal Medicine

## 2013-07-19 ENCOUNTER — Emergency Department (HOSPITAL_COMMUNITY): Payer: Medicare Other

## 2013-07-19 DIAGNOSIS — E119 Type 2 diabetes mellitus without complications: Secondary | ICD-10-CM

## 2013-07-19 DIAGNOSIS — Z981 Arthrodesis status: Secondary | ICD-10-CM

## 2013-07-19 DIAGNOSIS — R5381 Other malaise: Principal | ICD-10-CM | POA: Diagnosis present

## 2013-07-19 DIAGNOSIS — N179 Acute kidney failure, unspecified: Secondary | ICD-10-CM

## 2013-07-19 DIAGNOSIS — J9601 Acute respiratory failure with hypoxia: Secondary | ICD-10-CM

## 2013-07-19 DIAGNOSIS — Z79899 Other long term (current) drug therapy: Secondary | ICD-10-CM

## 2013-07-19 DIAGNOSIS — Z9884 Bariatric surgery status: Secondary | ICD-10-CM

## 2013-07-19 DIAGNOSIS — T426X5A Adverse effect of other antiepileptic and sedative-hypnotic drugs, initial encounter: Secondary | ICD-10-CM | POA: Diagnosis present

## 2013-07-19 DIAGNOSIS — I5031 Acute diastolic (congestive) heart failure: Secondary | ICD-10-CM

## 2013-07-19 DIAGNOSIS — R531 Weakness: Secondary | ICD-10-CM

## 2013-07-19 DIAGNOSIS — C259 Malignant neoplasm of pancreas, unspecified: Secondary | ICD-10-CM

## 2013-07-19 DIAGNOSIS — Z9221 Personal history of antineoplastic chemotherapy: Secondary | ICD-10-CM

## 2013-07-19 DIAGNOSIS — E43 Unspecified severe protein-calorie malnutrition: Secondary | ICD-10-CM

## 2013-07-19 DIAGNOSIS — E876 Hypokalemia: Secondary | ICD-10-CM

## 2013-07-19 DIAGNOSIS — I10 Essential (primary) hypertension: Secondary | ICD-10-CM | POA: Diagnosis present

## 2013-07-19 DIAGNOSIS — R5383 Other fatigue: Secondary | ICD-10-CM

## 2013-07-19 DIAGNOSIS — F3289 Other specified depressive episodes: Secondary | ICD-10-CM | POA: Diagnosis present

## 2013-07-19 DIAGNOSIS — J189 Pneumonia, unspecified organism: Secondary | ICD-10-CM

## 2013-07-19 DIAGNOSIS — I5042 Chronic combined systolic (congestive) and diastolic (congestive) heart failure: Secondary | ICD-10-CM | POA: Diagnosis present

## 2013-07-19 DIAGNOSIS — Z515 Encounter for palliative care: Secondary | ICD-10-CM

## 2013-07-19 DIAGNOSIS — Z96659 Presence of unspecified artificial knee joint: Secondary | ICD-10-CM

## 2013-07-19 DIAGNOSIS — I509 Heart failure, unspecified: Secondary | ICD-10-CM | POA: Diagnosis present

## 2013-07-19 DIAGNOSIS — E871 Hypo-osmolality and hyponatremia: Secondary | ICD-10-CM

## 2013-07-19 DIAGNOSIS — Z23 Encounter for immunization: Secondary | ICD-10-CM

## 2013-07-19 DIAGNOSIS — F329 Major depressive disorder, single episode, unspecified: Secondary | ICD-10-CM | POA: Diagnosis present

## 2013-07-19 DIAGNOSIS — E039 Hypothyroidism, unspecified: Secondary | ICD-10-CM

## 2013-07-19 DIAGNOSIS — I5043 Acute on chronic combined systolic (congestive) and diastolic (congestive) heart failure: Secondary | ICD-10-CM

## 2013-07-19 DIAGNOSIS — Z7982 Long term (current) use of aspirin: Secondary | ICD-10-CM

## 2013-07-19 DIAGNOSIS — I428 Other cardiomyopathies: Secondary | ICD-10-CM | POA: Diagnosis present

## 2013-07-19 DIAGNOSIS — J810 Acute pulmonary edema: Secondary | ICD-10-CM

## 2013-07-19 DIAGNOSIS — F411 Generalized anxiety disorder: Secondary | ICD-10-CM | POA: Diagnosis present

## 2013-07-19 DIAGNOSIS — E860 Dehydration: Secondary | ICD-10-CM

## 2013-07-19 LAB — COMPREHENSIVE METABOLIC PANEL WITH GFR
ALT: 35 U/L (ref 0–35)
AST: 18 U/L (ref 0–37)
Albumin: 1.6 g/dL — ABNORMAL LOW (ref 3.5–5.2)
BUN: 47 mg/dL — ABNORMAL HIGH (ref 6–23)
Calcium: 7.9 mg/dL — ABNORMAL LOW (ref 8.4–10.5)
Creatinine, Ser: 1.88 mg/dL — ABNORMAL HIGH (ref 0.50–1.10)
GFR calc non Af Amer: 25 mL/min — ABNORMAL LOW (ref >90)
Glucose, Bld: 351 mg/dL — ABNORMAL HIGH (ref 70–99)
Total Bilirubin: 1.9 mg/dL — ABNORMAL HIGH (ref 0.3–1.2)
Total Protein: 5.1 g/dL — ABNORMAL LOW (ref 6.0–8.3)

## 2013-07-19 LAB — AMMONIA: Ammonia: 24 umol/L (ref 11–60)

## 2013-07-19 LAB — LIPASE, BLOOD: Lipase: 5 U/L — ABNORMAL LOW (ref 11–59)

## 2013-07-19 LAB — COMPREHENSIVE METABOLIC PANEL
Alkaline Phosphatase: 393 U/L — ABNORMAL HIGH (ref 39–117)
CO2: 23 mEq/L (ref 19–32)
Chloride: 93 mEq/L — ABNORMAL LOW (ref 96–112)
GFR calc Af Amer: 29 mL/min — ABNORMAL LOW (ref >90)
Potassium: 5.3 mEq/L — ABNORMAL HIGH (ref 3.5–5.1)
Sodium: 125 mEq/L — ABNORMAL LOW (ref 135–145)

## 2013-07-19 NOTE — ED Notes (Signed)
Patient presents to ED via [facility]. Patient's family called from home c/o of patients "altered mental status." Per patient family- she is an "end stage pancreatic cancer patient, has not been eating much in the last 24 hours and seemed to be a little confused today. Per EMS patient BP was initally 110/58 and HR of 110. After a saline bolus- patient became more alert and was able to answer all questions appropriately. Upon arrival to ED patient is A&Ox3, lethargic but will respond to questions when asked.

## 2013-07-19 NOTE — ED Notes (Signed)
Phlebotomy at bedside.

## 2013-07-19 NOTE — ED Notes (Signed)
Old and New EKG given to Dr Oletta Lamas

## 2013-07-19 NOTE — ED Provider Notes (Signed)
CSN: [id number]     Arrival date & time 07/19/13  1957 History   First MD Initiated Contact with Patient 07/19/13 2010     Chief Complaint  Patient presents with  . Altered Mental Status   (Consider location/radiation/quality/duration/timing/severity/associated sxs/prior Treatment) HPI Comments: Yesterday pt was less energetic, only went from bathroom to bed, was awake, talkative, bnut didn't eat or drink much.  Pt with known pancreatic cancer with spread, failed on chemo, awaiting to begin a new trial regimen at [facility], has had increase in abd pain in the past 4 days, but no sig increase in use of chronic pain meds.  No N/V/D.  No cough, fevers, chills.  Today, pt has been much more listless, basically bed bound, not wanting to get up or have strength to get up.    Patient is a 78 y.o. female presenting with weakness. The history is provided by the patient, the spouse, medical records and a relative.  Weakness This is a new problem. The current episode started 2 days ago. The problem occurs constantly. The problem has been gradually worsening. Pertinent negatives include no chest pain, no abdominal pain, no headaches and no shortness of breath.    Past Medical History  Diagnosis Date  . Pancreatic adenoma   . Pancreatic cancer 12/03/2012  . Diabetes mellitus 12/03/2012  . Hypothyroidism 12/03/2012  . Clostridium difficile enterocolitis   . Depression   . Cardiomyopathy     a. 10/2012 Echo: EF 40-45%, basal inf, inflat, infsept HK, aortic sclerosis, mildly dil LA, mod MR, mild TR, mod PAH;  b. 11/2012 Echo: EF 50-55%, no rwma, gr 3 DD, Ao sclerosis, Mild MVP w mild to mod MR, mildly dil LA.  Marland Kitchen Hypertension   . Anxiety   . Iron deficiency anemia     a. h/o transfusion, most recently 10/2012 - [facility]   Past Surgical History  Procedure Laterality Date  . Gastric bypass    . Abdominal hysterectomy    . Back surgery    . Cervical fusion    . Right total knee replacement    . Cholecystectomy     . Appendectomy    . Bilateral cataract surgeries     History reviewed. No pertinent family history. History  Substance Use Topics  . Smoking status: Never Smoker   . Smokeless tobacco: Not on file  . Alcohol Use: No   OB History   Grav Para Term Preterm Abortions TAB SAB Ect Mult Living                 Review of Systems  Constitutional: Positive for appetite change and fatigue. Negative for fever and chills.  Respiratory: Negative for cough and shortness of breath.   Cardiovascular: Negative for chest pain.  Gastrointestinal: Negative for nausea, vomiting, abdominal pain and diarrhea.  Musculoskeletal: Negative for back pain.  Neurological: Positive for weakness. Negative for headaches.  All other systems reviewed and are negative.    Allergies  Fentanyl  Home Medications   Current Outpatient Rx  Name  Route  Sig  Dispense  Refill  . Alum & Mag Hydroxide-Simeth (MAGIC MOUTHWASH) SOLN   Oral   Take 15 mLs by mouth 4 (four) times daily.         . ARIPiprazole (ABILIFY) 5 MG tablet   Oral   Take 5 mg by mouth daily.         Marland Kitchen aspirin 81 MG chewable tablet   Oral   Chew 1 tablet (81  mg total) by mouth daily.         . clonazePAM (KLONOPIN) 0.5 MG tablet   Oral   Take 0.5 mg by mouth 2 (two) times daily as needed for anxiety.         Marland Kitchen escitalopram (LEXAPRO) 10 MG tablet   Oral   Take 10 mg by mouth daily.         . furosemide (LASIX) 40 MG tablet   Oral   Take 1 tablet (40 mg total) by mouth 2 (two) times daily.   60 tablet   0   . levothyroxine (SYNTHROID, LEVOTHROID) 112 MCG tablet   Oral   Take 112 mcg by mouth daily before breakfast.         . lidocaine-prilocaine (EMLA) cream   Topical   Apply 1 application topically as needed (Apply 30 gm 45 minutes before port).         . lipase/protease/amylase (CREON-12/PANCREASE) 12000 UNITS CPEP   Oral   Take 1 capsule by mouth 3 (three) times daily with meals.         . nebivolol  (BYSTOLIC) 5 MG tablet   Oral   Take 5 mg by mouth daily.         . ondansetron (ZOFRAN) 8 MG tablet   Oral   Take by mouth every 8 (eight) hours as needed for nausea.         Marland Kitchen oxycodone (OXY-IR) 5 MG capsule   Oral   Take 5 mg by mouth every 4 (four) hours as needed for pain.         . potassium chloride SA (K-DUR,KLOR-CON) 20 MEQ tablet   Oral   Take 2 tablets (40 mEq total) by mouth daily.   30 tablet   0   . solifenacin (VESICARE) 10 MG tablet   Oral   Take 10 mg by mouth daily.         . traMADol (ULTRAM) 50 MG tablet   Oral   Take 50 mg by mouth every 6 (six) hours as needed for pain.         Marland Kitchen zolpidem (AMBIEN) 10 MG tablet   Oral   Take 10 mg by mouth at bedtime.         . Multiple Vitamin (MULTIVITAMIN WITH MINERALS) TABS   Oral   Take 1 tablet by mouth daily.          BP 119/61  Pulse 71  Temp(Src) 98.7 F (37.1 C) (Oral)  Resp 17  SpO2 100% Physical Exam  Nursing note and vitals reviewed. Constitutional: She is oriented to person, place, and time. She appears well-developed and well-nourished.  HENT:  Head: Normocephalic and atraumatic.  Mouth/Throat: Uvula is midline. Mucous membranes are dry.  Eyes: Conjunctivae and EOM are normal. No scleral icterus.  Neck: Full passive range of motion without pain. Neck supple. No Brudzinski's sign and no Kernig's sign noted.  Cardiovascular: Normal rate, regular rhythm and intact distal pulses.   Pulmonary/Chest: Effort normal. No respiratory distress.  Abdominal: Soft. She exhibits no distension. There is no tenderness. There is no rebound.  Neurological: She is alert and oriented to person, place, and time. She exhibits normal muscle tone. Coordination normal.  Skin: Skin is warm. No rash noted. She is not diaphoretic.    ED Course  Procedures (including critical care time) Labs Review Labs Reviewed  CBC WITH DIFFERENTIAL - Abnormal; Notable for the following:    WBC 14.6 (*)  RBC 3.33  (*)    Hemoglobin 9.1 (*)    HCT 26.3 (*)    RDW 15.8 (*)    Platelets 94 (*)    Neutrophils Relative % 89 (*)    Lymphocytes Relative 4 (*)    Neutro Abs 13.0 (*)    Lymphs Abs 0.6 (*)    All other components within normal limits  COMPREHENSIVE METABOLIC PANEL - Abnormal; Notable for the following:    Sodium 125 (*)    Potassium 5.3 (*)    Chloride 93 (*)    Glucose, Bld 351 (*)    BUN 47 (*)    Creatinine, Ser 1.88 (*)    Calcium 7.9 (*)    Total Protein 5.1 (*)    Albumin 1.6 (*)    Alkaline Phosphatase 393 (*)    Total Bilirubin 1.9 (*)    GFR calc non Af Amer 25 (*)    GFR calc Af Amer 29 (*)    All other components within normal limits  LIPASE, BLOOD - Abnormal; Notable for the following:    Lipase 5 (*)    All other components within normal limits  AMMONIA  URINALYSIS, ROUTINE W REFLEX MICROSCOPIC   Imaging Review Ct Head Wo Contrast  07/19/2013   CLINICAL DATA:  altered mental status  EXAM: CT HEAD WITHOUT CONTRAST  TECHNIQUE: Contiguous axial images were obtained from the base of the skull through the vertex without intravenous contrast.  COMPARISON:  None.  FINDINGS: The cerebral and cerebellar hemispheres have a normal attenuation and morphology. No midline shift, ventriculomegaly, mass effect, evidence of mass lesion, intracranial hemorrhage or evidence of cortically based acute infarction. Gray-white matter differentiation is within normal limits throughout the brain. The paranasal sinuses and the mastoid air cells are clear. The skull is intact.  IMPRESSION: 1. No acute intracranial abnormalities.   Electronically Signed   By: Signa Kell M.D.   On: 07/19/2013 23:29   Dg Chest Port 1 View  07/19/2013   CLINICAL DATA:  Dehydration, nausea, shortness of breath.  EXAM: PORTABLE CHEST - 1 VIEW  COMPARISON:  Chest radiograph March 29, 2013  FINDINGS: Cardiac silhouette is upper limits of normal in size. Moderately calcified aortic knob. Mild chronic interstitial  changes with left lung base scarring, no superimposed pleural effusions or focal consolidations. No pneumothorax.  Dual lumen right chest Port-A-Cath with distal catheter tip projecting distal superior vena cava. Multiple EKG lines overlie the patient and may obscure subtle underlying pathology. Remote left lateral rib fractures. Status post lower thoracic and mid lumbar vertebroplasty. Coil material projecting in the abdomen suggests prior herniorrhaphy.  IMPRESSION: Borderline cardiomegaly, mild chronic interstitial changes without superimposed acute pulmonary process.   Electronically Signed   By: Awilda Metro   On: 07/19/2013 22:37    EKG Interpretation    Date/Time:  Friday July 19 2013 20:15:26 EST Ventricular Rate:  89 PR Interval:  150 QRS Duration: 83 QT Interval:  345 QTC Calculation: 420 R Axis:   -6 Text Interpretation:  Sinus rhythm Artifact ST elevation, consider early repolarization, pericarditis, or injury Borderline ECG Confirmed by [facility]  MD, MICHEAL ([id number]) on 07/19/2013 8:36:45 PM           RA sat is 100% and I interpret to be normal MDM   1. Dehydration with hyponatremia   2. Weakness      Pt is fatigues, listless, but oriented, following commands, answering questions appropriately.  Pt clinically and by history is likely dehydrated  and weakness is likely related.  Will check head CT given h/o metastatic cancer.  I attempted to look up MRI brain results from [facility], but unable to locate on Care Everywhere portal.  Will continue IVF's, monitor and discuss with Hospitalist.      Gavin Pound. Oletta Lamas, MD 07/20/13 1610

## 2013-07-20 ENCOUNTER — Encounter (HOSPITAL_COMMUNITY): Payer: Self-pay | Admitting: Internal Medicine

## 2013-07-20 DIAGNOSIS — E43 Unspecified severe protein-calorie malnutrition: Secondary | ICD-10-CM | POA: Insufficient documentation

## 2013-07-20 DIAGNOSIS — E860 Dehydration: Secondary | ICD-10-CM | POA: Diagnosis present

## 2013-07-20 DIAGNOSIS — E039 Hypothyroidism, unspecified: Secondary | ICD-10-CM

## 2013-07-20 DIAGNOSIS — E871 Hypo-osmolality and hyponatremia: Secondary | ICD-10-CM

## 2013-07-20 DIAGNOSIS — N179 Acute kidney failure, unspecified: Secondary | ICD-10-CM | POA: Diagnosis present

## 2013-07-20 DIAGNOSIS — R5383 Other fatigue: Secondary | ICD-10-CM | POA: Diagnosis present

## 2013-07-20 LAB — CBC
HCT: 25.1 % — ABNORMAL LOW (ref 36.0–46.0)
Hemoglobin: 8.6 g/dL — ABNORMAL LOW (ref 12.0–15.0)
MCH: 27.5 pg (ref 26.0–34.0)
MCHC: 34.3 g/dL (ref 30.0–36.0)
MCV: 80.2 fL (ref 78.0–100.0)
RDW: 16 % — ABNORMAL HIGH (ref 11.5–15.5)
WBC: 9.9 10*3/uL (ref 4.0–10.5)

## 2013-07-20 LAB — CBC WITH DIFFERENTIAL/PLATELET
Basophils Absolute: 0 K/uL (ref 0.0–0.1)
Basophils Relative: 0 % (ref 0–1)
Eosinophils Absolute: 0 K/uL (ref 0.0–0.7)
Eosinophils Relative: 0 % (ref 0–5)
HCT: 26.3 % — ABNORMAL LOW (ref 36.0–46.0)
Hemoglobin: 9.1 g/dL — ABNORMAL LOW (ref 12.0–15.0)
Lymphocytes Relative: 4 % — ABNORMAL LOW (ref 12–46)
Lymphs Abs: 0.6 K/uL — ABNORMAL LOW (ref 0.7–4.0)
MCH: 27.3 pg (ref 26.0–34.0)
MCHC: 34.6 g/dL (ref 30.0–36.0)
MCV: 79 fL (ref 78.0–100.0)
Monocytes Absolute: 1 K/uL (ref 0.1–1.0)
Monocytes Relative: 7 % (ref 3–12)
Neutro Abs: 13 K/uL — ABNORMAL HIGH (ref 1.7–7.7)
Neutrophils Relative %: 89 % — ABNORMAL HIGH (ref 43–77)
Platelets: 94 K/uL — ABNORMAL LOW (ref 150–400)
RBC: 3.33 MIL/uL — ABNORMAL LOW (ref 3.87–5.11)
RDW: 15.8 % — ABNORMAL HIGH (ref 11.5–15.5)
WBC: 14.6 K/uL — ABNORMAL HIGH (ref 4.0–10.5)

## 2013-07-20 LAB — BASIC METABOLIC PANEL
BUN: 47 mg/dL — ABNORMAL HIGH (ref 6–23)
CO2: 21 mEq/L (ref 19–32)
Calcium: 8 mg/dL — ABNORMAL LOW (ref 8.4–10.5)
Chloride: 94 mEq/L — ABNORMAL LOW (ref 96–112)
Creatinine, Ser: 1.78 mg/dL — ABNORMAL HIGH (ref 0.50–1.10)
Glucose, Bld: 388 mg/dL — ABNORMAL HIGH (ref 70–99)
Potassium: 5.4 mEq/L — ABNORMAL HIGH (ref 3.5–5.1)

## 2013-07-20 MED ORDER — SODIUM CHLORIDE 0.9 % IV BOLUS (SEPSIS)
1000.0000 mL | Freq: Once | INTRAVENOUS | Status: AC
  Administered 2013-07-20: 1000 mL via INTRAVENOUS

## 2013-07-20 MED ORDER — ENSURE PUDDING PO PUDG
1.0000 | Freq: Two times a day (BID) | ORAL | Status: DC
  Administered 2013-07-20 – 2013-07-21 (×2): 1 via ORAL

## 2013-07-20 MED ORDER — ASPIRIN 81 MG PO CHEW
81.0000 mg | CHEWABLE_TABLET | Freq: Every day | ORAL | Status: DC
  Administered 2013-07-20 – 2013-07-23 (×4): 81 mg via ORAL
  Filled 2013-07-20 (×4): qty 1

## 2013-07-20 MED ORDER — SODIUM POLYSTYRENE SULFONATE 15 GM/60ML PO SUSP
30.0000 g | Freq: Once | ORAL | Status: AC
  Administered 2013-07-20: 30 g via ORAL
  Filled 2013-07-20: qty 120

## 2013-07-20 MED ORDER — ESCITALOPRAM OXALATE 10 MG PO TABS
10.0000 mg | ORAL_TABLET | Freq: Every day | ORAL | Status: DC
  Administered 2013-07-20 – 2013-07-23 (×4): 10 mg via ORAL
  Filled 2013-07-20 (×4): qty 1

## 2013-07-20 MED ORDER — HEPARIN SODIUM (PORCINE) 5000 UNIT/ML IJ SOLN
5000.0000 [IU] | Freq: Three times a day (TID) | INTRAMUSCULAR | Status: DC
  Administered 2013-07-20 – 2013-07-23 (×10): 5000 [IU] via SUBCUTANEOUS
  Filled 2013-07-20 (×13): qty 1

## 2013-07-20 MED ORDER — DARIFENACIN HYDROBROMIDE ER 7.5 MG PO TB24
7.5000 mg | ORAL_TABLET | Freq: Every day | ORAL | Status: DC
  Administered 2013-07-20 – 2013-07-23 (×4): 7.5 mg via ORAL
  Filled 2013-07-20 (×4): qty 1

## 2013-07-20 MED ORDER — OXYCODONE HCL 5 MG PO TABS
5.0000 mg | ORAL_TABLET | ORAL | Status: DC | PRN
  Administered 2013-07-20 – 2013-07-23 (×5): 5 mg via ORAL
  Filled 2013-07-20 (×5): qty 1

## 2013-07-20 MED ORDER — LIDOCAINE-PRILOCAINE 2.5-2.5 % EX CREA: 1.0000 "application " | TOPICAL_CREAM | CUTANEOUS | Status: DC | PRN

## 2013-07-20 MED ORDER — DEXTROSE-NACL 5-0.9 % IV SOLN
INTRAVENOUS | Status: DC
  Administered 2013-07-20: 03:00:00 via INTRAVENOUS

## 2013-07-20 MED ORDER — BOOST / RESOURCE BREEZE PO LIQD
1.0000 | ORAL | Status: DC
  Administered 2013-07-21 – 2013-07-23 (×3): 1 via ORAL

## 2013-07-20 MED ORDER — NEBIVOLOL HCL 5 MG PO TABS
5.0000 mg | ORAL_TABLET | Freq: Every day | ORAL | Status: DC
  Administered 2013-07-20 – 2013-07-21 (×2): 5 mg via ORAL
  Filled 2013-07-20 (×4): qty 1

## 2013-07-20 MED ORDER — SODIUM CHLORIDE 0.9 % IJ SOLN
3.0000 mL | Freq: Two times a day (BID) | INTRAMUSCULAR | Status: DC
  Administered 2013-07-20 – 2013-07-22 (×4): 3 mL via INTRAVENOUS

## 2013-07-20 MED ORDER — ADULT MULTIVITAMIN W/MINERALS CH
1.0000 | ORAL_TABLET | Freq: Every day | ORAL | Status: DC
  Administered 2013-07-20 – 2013-07-23 (×4): 1 via ORAL
  Filled 2013-07-20 (×4): qty 1

## 2013-07-20 MED ORDER — LEVOTHYROXINE SODIUM 112 MCG PO TABS
112.0000 ug | ORAL_TABLET | Freq: Every day | ORAL | Status: DC
  Administered 2013-07-20 – 2013-07-23 (×4): 112 ug via ORAL
  Filled 2013-07-20 (×6): qty 1

## 2013-07-20 MED ORDER — POTASSIUM CHLORIDE CRYS ER 20 MEQ PO TBCR: 40.0000 meq | EXTENDED_RELEASE_TABLET | Freq: Every day | ORAL | Status: DC

## 2013-07-20 MED ORDER — OXYCODONE HCL 5 MG PO CAPS: 5.0000 mg | ORAL_CAPSULE | ORAL | Status: DC | PRN

## 2013-07-20 MED ORDER — INFLUENZA VAC SPLIT QUAD 0.5 ML IM SUSP
0.5000 mL | INTRAMUSCULAR | Status: AC
  Administered 2013-07-21: 0.5 mL via INTRAMUSCULAR
  Filled 2013-07-20: qty 0.5

## 2013-07-20 MED ORDER — SODIUM CHLORIDE 0.9 % IV SOLN
INTRAVENOUS | Status: AC
  Administered 2013-07-20: 11:00:00 via INTRAVENOUS

## 2013-07-20 NOTE — Progress Notes (Signed)
INITIAL NUTRITION ASSESSMENT  DOCUMENTATION CODES Per approved criteria  -Severe malnutrition in the context of chronic illness  Pt meets criteria for SEVERE MALNUTRITION in the context of CHRONIC ILLNESS as evidenced by 24% weight loss in less than 8 months and estimated energy intake <75% of estimated energy needs for > 1 month.  INTERVENTION: Provide Ensure Pudding BID Provide Magic Cup BID Provide Resource Breeze once daily  NUTRITION DIAGNOSIS: Inadequate oral intake related to poor appetite as evidenced by 24% weight loss in less than 8 months.   Goal: Pt to meet >/= 90% of their estimated nutrition needs   Monitor:  PO intake Weight Labs  Reason for Assessment: MST  78 y.o. female  Admitting Dx: Lethargy  ASSESSMENT: 78 y.o. female with hx of pancreatic cancer, s/p prior chemotherapy, with plan to follow up for experimental therapy at [facility], hx of DM, HTN, cardiomyopathy, anxiety, hypothyroidism, brought in to the ER by husband and family as she has been more lethargic for the past 2 days.   Pt reports that she used to weigh 145 lbs to 150 lbs but, she has had a very poor appetite for several months and has continued to lose weight. Pt reports eating small amounts of food 4-5 times daily PTA. She tried Ensure and Boost nutritional supplements but, doesn't like them. Pt willing to try pudding, ice cream, and resource breeze supplements. Pt reports eating <50% of breakfast and lunch but, is hungry now and feels she will eat better at dinner. Encouraged pt to continue snacking as tolerated. Encourage intake of nutritional supplements; will add a variety for pt to try while admitted.   Height: Ht Readings from Last 1 Encounters:  07/20/13 5' (1.524 m)    Weight: Wt Readings from Last 1 Encounters:  07/20/13 105 lb 8 oz (47.854 kg)    Ideal Body Weight: 100 lbs  % Ideal Body Weight: 105%  Wt Readings from Last 10 Encounters:  07/20/13 105 lb 8 oz (47.854 kg)   12/07/12 139 lb 1.8 oz (63.1 kg)  12/07/12 139 lb 1.8 oz (63.1 kg)    Usual Body Weight: 145 lbs  % Usual Body Weight: 72%  BMI:  Body mass index is 20.6 kg/(m^2).  Estimated Nutritional Needs: Kcal: 1250-1450 Protein: 65-75 grams Fluid: 1.4 L/day  Skin: non-pitting RLE and LLE edema; intact  Diet Order: General  EDUCATION NEEDS: -No education needs identified at this time   Intake/Output Summary (Last 24 hours) at 07/20/13 1515 Last data filed at 07/20/13 0938  Gross per 24 hour  Intake 2273.33 ml  Output      0 ml  Net 2273.33 ml    Last BM: 12/26   Labs:   Recent Labs Lab 07/19/13 2304 07/20/13 0330  NA 125* 124*  K 5.3* 5.4*  CL 93* 94*  CO2 23 21  BUN 47* 47*  CREATININE 1.88* 1.78*  CALCIUM 7.9* 8.0*  GLUCOSE 351* 388*    CBG (last 3)  No results found for this basename: GLUCAP,  in the last 72 hours  Scheduled Meds: . aspirin  81 mg Oral Daily  . darifenacin  7.5 mg Oral Daily  . escitalopram  10 mg Oral Daily  . heparin  5,000 Units Subcutaneous Q8H  . [START ON 07/21/2013] influenza vac split quadrivalent PF  0.5 mL Intramuscular Tomorrow-1000  . levothyroxine  112 mcg Oral QAC breakfast  . multivitamin with minerals  1 tablet Oral Daily  . nebivolol  5 mg Oral Daily  .  sodium chloride  3 mL Intravenous Q12H    Continuous Infusions: . sodium chloride 75 mL/hr at 07/20/13 1046    Past Medical History  Diagnosis Date  . Pancreatic adenoma   . Pancreatic cancer 12/03/2012  . Diabetes mellitus 12/03/2012  . Hypothyroidism 12/03/2012  . Clostridium difficile enterocolitis   . Depression   . Cardiomyopathy     a. 10/2012 Echo: EF 40-45%, basal inf, inflat, infsept HK, aortic sclerosis, mildly dil LA, mod MR, mild TR, mod PAH;  b. 11/2012 Echo: EF 50-55%, no rwma, gr 3 DD, Ao sclerosis, Mild MVP w mild to mod MR, mildly dil LA.  Marland Kitchen Hypertension   . Anxiety   . Iron deficiency anemia     a. h/o transfusion, most recently 10/2012 - [facility]     Past Surgical History  Procedure Laterality Date  . Gastric bypass    . Abdominal hysterectomy    . Back surgery    . Cervical fusion    . Right total knee replacement    . Cholecystectomy    . Appendectomy    . Bilateral cataract surgeries      Ian Malkin RD, LDN Inpatient Clinical Dietitian Pager: [phone] After Hours Pager: [phone]

## 2013-07-20 NOTE — ED Notes (Signed)
Admitting physician at bedside

## 2013-07-20 NOTE — Progress Notes (Signed)
Patient Active Problem List   Diagnosis Date Noted  . Lethargy 07/20/2013  . Dehydration 07/20/2013  . AKI (acute kidney injury) 07/20/2013  . Hyponatremia 07/20/2013  . Acute diastolic CHF (congestive heart failure) 12/07/2012  . Acute on chronic combined systolic and diastolic CHF (congestive heart failure) 12/06/2012  . Hypokalemia 12/06/2012  . Pancreatic cancer 12/03/2012  . Diabetes mellitus 12/03/2012  . Hypothyroidism 12/03/2012  . Acute respiratory failure with hypoxia 12/03/2012  . Acute pulmonary edema 12/03/2012  . HCAP (healthcare-associated pneumonia) 12/03/2012   BP 110/59  Pulse 65  Temp(Src) 97.5 F (36.4 C) (Oral)  Resp 18  Ht 5' (1.524 m)  Wt 47.854 kg (105 lb 8 oz)  BMI 20.60 kg/m2  SpO2 100% - Agree with plan - Pt A&O x3. Hungry wants to eat. Is currently eating a donut her son brought her. - Change IV fluids to NS, hold lasix, check b-met in am. - resume narcotics at a lower dose in am.

## 2013-07-20 NOTE — H&P (Signed)
[facility] History and Physical  Tonya Wallace EAV:[id number] DOB: [birth year]    PCP:   Tonya Code, MD   Chief Complaint: lethargic and decrease oral intake.  HPI: Tonya Wallace is an 78 y.o. female with hx of pancreatic cancer, s/p prior chemotherapy, with plan to follow up for experimental therapy at [facility], hx of DM, HTN, cardiomyopathy, anxiety, hypothyroidism, brought in to the ER by husband and family as she has been more lethargic for the past 2 days.  Husband said she has not been eating or drinking as much.  Upon close questioning, there may have been confusion in her medications which she took.  He thought she may have taken more pain meds than usual, though he was not sure.  There has been no seizure activities, no fever, chills, or coughs.  She has been lethargic, but easily arousable.  Evalaution in the ER included a head CT which showed no acute process, a CXR showed some vascular congestion, but no infiltrate, a WBC of 14.6K (on decadron), Hb of 9.1, and BUN/ Cr elevation at 47/ 1.9.  Her sodium is low at 125, which is new.  She appears clinically dehydrated, and was lethargic.  Her NH3 level was normal.  Hospitalist was asked to admit her for volume depletion, hyponatremia, and AKI.  Rewiew of Systems: She is lethargic and was not able to give reliable ROS.  Past Medical History  Diagnosis Date  . Pancreatic adenoma   . Pancreatic cancer 12/03/2012  . Diabetes mellitus 12/03/2012  . Hypothyroidism 12/03/2012  . Clostridium difficile enterocolitis   . Depression   . Cardiomyopathy     a. 10/2012 Echo: EF 40-45%, basal inf, inflat, infsept HK, aortic sclerosis, mildly dil LA, mod MR, mild TR, mod PAH;  b. 11/2012 Echo: EF 50-55%, no rwma, gr 3 DD, Ao sclerosis, Mild MVP w mild to mod MR, mildly dil LA.  Marland Kitchen Hypertension   . Anxiety   . Iron deficiency anemia     a. h/o transfusion, most recently 10/2012 - Tonya Wallace    Past Surgical History  Procedure Laterality Date   . Gastric bypass    . Abdominal hysterectomy    . Back surgery    . Cervical fusion    . Right total knee replacement    . Cholecystectomy    . Appendectomy    . Bilateral cataract surgeries      Medications:  HOME MEDS: Prior to Admission medications   Medication Sig Start Date End Date Taking? Authorizing Provider  Alum & Mag Hydroxide-Simeth (MAGIC MOUTHWASH) SOLN Take 15 mLs by mouth 4 (four) times daily.   Yes Historical Provider, MD  ARIPiprazole (ABILIFY) 5 MG tablet Take 5 mg by mouth daily.   Yes Historical Provider, MD  aspirin 81 MG chewable tablet Chew 1 tablet (81 mg total) by mouth daily. 12/07/12  Yes Tonya Hartshorn, MD  clonazePAM (KLONOPIN) 0.5 MG tablet Take 0.5 mg by mouth 2 (two) times daily as needed for anxiety.   Yes Historical Provider, MD  escitalopram (LEXAPRO) 10 MG tablet Take 10 mg by mouth daily.   Yes Historical Provider, MD  furosemide (LASIX) 40 MG tablet Take 1 tablet (40 mg total) by mouth 2 (two) times daily. 12/07/12  Yes Tonya Hartshorn, MD  levothyroxine (SYNTHROID, LEVOTHROID) 112 MCG tablet Take 112 mcg by mouth daily before breakfast.   Yes Historical Provider, MD  lidocaine-prilocaine (EMLA) cream Apply 1 application topically as needed (Apply 30 gm 45 minutes  before port).   Yes Historical Provider, MD  lipase/protease/amylase (CREON-12/PANCREASE) 12000 UNITS CPEP Take 1 capsule by mouth 3 (three) times daily with meals.   Yes Historical Provider, MD  nebivolol (BYSTOLIC) 5 MG tablet Take 5 mg by mouth daily.   Yes Historical Provider, MD  ondansetron (ZOFRAN) 8 MG tablet Take by mouth every 8 (eight) hours as needed for nausea.   Yes Historical Provider, MD  oxycodone (OXY-IR) 5 MG capsule Take 5 mg by mouth every 4 (four) hours as needed for pain.   Yes Historical Provider, MD  potassium chloride SA (K-DUR,KLOR-CON) 20 MEQ tablet Take 2 tablets (40 mEq total) by mouth daily. 12/07/12  Yes Tonya Hartshorn, MD  solifenacin (VESICARE) 10 MG tablet Take 10 mg by  mouth daily.   Yes Historical Provider, MD  traMADol (ULTRAM) 50 MG tablet Take 50 mg by mouth every 6 (six) hours as needed for pain.   Yes Historical Provider, MD  zolpidem (AMBIEN) 10 MG tablet Take 10 mg by mouth at bedtime.   Yes Historical Provider, MD  Multiple Vitamin (MULTIVITAMIN WITH MINERALS) TABS Take 1 tablet by mouth daily.    Historical Provider, MD     Allergies:  Allergies  Allergen Reactions  . Fentanyl Itching    Social History:   reports that she has never smoked. She does not have any smokeless tobacco history on file. She reports that she does not drink alcohol. Her drug history is not on file.  Family History: History reviewed. No pertinent family history.   Physical Exam: Filed Vitals:   07/20/13 0100 07/20/13 0115 07/20/13 0130 07/20/13 0209  BP: 105/57 120/58 120/60 121/55  Pulse: 63 64 65 66  Temp:    97.7 F (36.5 C)  TempSrc:    Oral  Resp: 14 14 18 16   Height:    5' (1.524 m)  Weight:    47.854 kg (105 lb 8 oz)  SpO2: 100% 100% 100% 99%   Blood pressure 121/55, pulse 66, temperature 97.7 F (36.5 C), temperature source Oral, resp. rate 16, height 5' (1.524 m), weight 47.854 kg (105 lb 8 oz), SpO2 99.00%.  GEN:  Pleasant  patient lying in the stretcher in no acute distress; cooperative with exam. But lethargic. PSYCH:  alert and oriented x4; does not appear anxious or depressed; affect is appropriate. HEENT: Mucous membranes pink and anicteric; PERRLA; EOM intact; no cervical lymphadenopathy nor thyromegaly or carotid bruit; no JVD; There were no stridor. Neck is very supple. Breasts:: Not examined CHEST WALL: No tenderness CHEST: Normal respiration, clear to auscultation bilaterally.  HEART: Regular rate and rhythm.  There are no murmur, rub, or gallops.   BACK: No kyphosis or scoliosis; no CVA tenderness ABDOMEN: soft and non-tender; no masses, no organomegaly, normal abdominal bowel sounds; no pannus; no intertriginous candida. There is no  rebound and no distention. Rectal Exam: Not done EXTREMITIES: No bone or joint deformity; age-appropriate arthropathy of the hands and knees; no edema; no ulcerations.  There is no calf tenderness. Genitalia: not examined PULSES: 2+ and symmetric SKIN: Normal hydration no rash or ulceration CNS: Cranial nerves 2-12 grossly intact no focal lateralizing neurologic deficit.  Speech is fluent; uvula elevated with phonation, facial symmetry and tongue midline. DTR are normal bilaterally, cerebella exam is intact, barbinski is negative and strengths are equaled bilaterally.  No sensory loss.   Labs on Admission:  Basic Metabolic Panel:  Recent Labs Lab 07/19/13 2304  NA 125*  K 5.3*  CL  93*  CO2 23  GLUCOSE 351*  BUN 47*  CREATININE 1.88*  CALCIUM 7.9*   Liver Function Tests:  Recent Labs Lab 07/19/13 2304  AST 18  ALT 35  ALKPHOS 393*  BILITOT 1.9*  PROT 5.1*  ALBUMIN 1.6*    Recent Labs Lab 07/19/13 2304  LIPASE 5*    Recent Labs Lab 07/19/13 2304  AMMONIA 24   CBC:  Recent Labs Lab 07/19/13 2304  WBC 14.6*  NEUTROABS 13.0*  HGB 9.1*  HCT 26.3*  MCV 79.0  PLT 94*   Cardiac Enzymes: No results found for this basename: CKTOTAL, CKMB, CKMBINDEX, TROPONINI,  in the last 168 hours  CBG: No results found for this basename: GLUCAP,  in the last 168 hours   Radiological Exams on Admission: Ct Head Wo Contrast  07/19/2013   CLINICAL DATA:  altered mental status  EXAM: CT HEAD WITHOUT CONTRAST  TECHNIQUE: Contiguous axial images were obtained from the base of the skull through the vertex without intravenous contrast.  COMPARISON:  None.  FINDINGS: The cerebral and cerebellar hemispheres have a normal attenuation and morphology. No midline shift, ventriculomegaly, mass effect, evidence of mass lesion, intracranial hemorrhage or evidence of cortically based acute infarction. Gray-white matter differentiation is within normal limits throughout the brain. The  paranasal sinuses and the mastoid air cells are clear. The skull is intact.  IMPRESSION: 1. No acute intracranial abnormalities.   Electronically Signed   By: Signa Kell M.D.   On: 07/19/2013 23:29   Dg Chest Port 1 View  07/19/2013   CLINICAL DATA:  Dehydration, nausea, shortness of breath.  EXAM: PORTABLE CHEST - 1 VIEW  COMPARISON:  Chest radiograph March 29, 2013  FINDINGS: Cardiac silhouette is upper limits of normal in size. Moderately calcified aortic knob. Mild chronic interstitial changes with left lung base scarring, no superimposed pleural effusions or focal consolidations. No pneumothorax.  Dual lumen right chest Port-A-Cath with distal catheter tip projecting distal superior vena cava. Multiple EKG lines overlie the patient and may obscure subtle underlying pathology. Remote left lateral rib fractures. Status post lower thoracic and mid lumbar vertebroplasty. Coil material projecting in the abdomen suggests prior herniorrhaphy.  IMPRESSION: Borderline cardiomegaly, mild chronic interstitial changes without superimposed acute pulmonary process.   Electronically Signed   By: Awilda Metro   On: 07/19/2013 22:37   Assessment/Plan Present on Admission:  . Lethargy . Dehydration . Hypothyroidism . Diabetes mellitus . Pancreatic cancer . AKI (acute kidney injury) . Hyponatremia  PLAN: The cause of her lethargy, though multifactorial, is most likely because of sedative medications.  I think she may have taken some narcotics this past few days and became more lethargic.  Family said she may have mistakingly taken more than she should.  She is also dehydrated now, with elevated BUN/CR and in AKI. For her hypothyrodism, will continue her supplement and check TSH.  Her DM will be followed with [facility].  Her hyponatremia is hypovolemic hyponatremia, so will give her saline.  For her pancreatic cancer, she should follow up with her oncology at [facility] for further therapy.  I did discuss her Wallace  status, and confirmed tonight that she would like to be a full Wallace.  Thank you for asking me to participate in her care.  Other plans as per orders.  Wallace Status: FULL Unk Lightning, MD. [facility] Pager [phone] 7pm to 7am.  07/20/2013, 4:07 AM

## 2013-07-21 DIAGNOSIS — E876 Hypokalemia: Secondary | ICD-10-CM

## 2013-07-21 DIAGNOSIS — R5381 Other malaise: Principal | ICD-10-CM

## 2013-07-21 DIAGNOSIS — E43 Unspecified severe protein-calorie malnutrition: Secondary | ICD-10-CM

## 2013-07-21 DIAGNOSIS — C259 Malignant neoplasm of pancreas, unspecified: Secondary | ICD-10-CM

## 2013-07-21 DIAGNOSIS — E119 Type 2 diabetes mellitus without complications: Secondary | ICD-10-CM

## 2013-07-21 LAB — BASIC METABOLIC PANEL
BUN: 39 mg/dL — ABNORMAL HIGH (ref 6–23)
CO2: 18 mEq/L — ABNORMAL LOW (ref 19–32)
Calcium: 8 mg/dL — ABNORMAL LOW (ref 8.4–10.5)
Creatinine, Ser: 1.63 mg/dL — ABNORMAL HIGH (ref 0.50–1.10)
GFR calc non Af Amer: 30 mL/min — ABNORMAL LOW (ref >90)
Glucose, Bld: 459 mg/dL — ABNORMAL HIGH (ref 70–99)
Potassium: 4.3 mEq/L (ref 3.5–5.1)
Sodium: 122 mEq/L — ABNORMAL LOW (ref 135–145)

## 2013-07-21 MED ORDER — SODIUM CHLORIDE 0.9 % IV SOLN
INTRAVENOUS | Status: AC
  Administered 2013-07-21: 12:00:00 via INTRAVENOUS

## 2013-07-21 MED ORDER — INSULIN ASPART 100 UNIT/ML ~~LOC~~ SOLN
0.0000 [IU] | Freq: Three times a day (TID) | SUBCUTANEOUS | Status: DC
Start: 2013-07-22 — End: 2013-07-22

## 2013-07-21 NOTE — Progress Notes (Signed)
[facility]                                                                                Patient Demographics  Tonya Wallace, is a 78 y.o. female, DOB - Sep 26, 1934, AOZ:[id number]  Admit date - 07/19/2013   Admitting Physician Houston Siren, MD  Outpatient Primary MD for the patient is Lyndon Code, MD  LOS - 2   Chief Complaint  Patient presents with  . Altered Mental Status        Assessment & Plan   Principal Problem:   Lethargy Active Problems:   Pancreatic cancer   Diabetes mellitus   Hypothyroidism   Dehydration   AKI (acute kidney injury)   Hyponatremia   Protein-calorie malnutrition, severe  Lethargy with Altered mental status -Likely multifactorial including sedatives versus dehydration -Patient currently back to baseline. -Will continue to monitor  Acute kidney injury with hyponatremia -Likely secondary to dehydration -Baseline creatinine was 1 -Current creatinine 1.63, trending downward -Avoid nephrotoxic agents -Will encourage PO intake and place on IVF at 41mL/hr for 10 hours  Protein-calorie malnutrition -Nutrition conuslted -Continue ensure  Hypothyroidism -Continue Synthroid -TSH 0.753  Diabetes mellitus -Continue insulin sliding scale with CBG monitoring  Pancreatic Cancer -Followed at [facility]  Code Status: Full  Family Communication: Son at bedside  Disposition Plan: Admitted, will likely discharge with stable.   Procedures none  Consults  none  DVT Prophylaxis  Heparin  Lab Results  Component Value Date   PLT 75* 07/20/2013    Medications  Scheduled Meds: . aspirin  81 mg Oral Daily  . darifenacin  7.5 mg Oral Daily  . escitalopram  10 mg Oral Daily  . feeding supplement (ENSURE)  1 Container Oral BID PC  . feeding supplement (RESOURCE BREEZE)  1 Container Oral Q24H  . heparin  5,000 Units Subcutaneous Q8H  . influenza vac split quadrivalent PF  0.5 mL Intramuscular Tomorrow-1000   . levothyroxine  112 mcg Oral QAC breakfast  . multivitamin with minerals  1 tablet Oral Daily  . nebivolol  5 mg Oral Daily  . sodium chloride  3 mL Intravenous Q12H   Continuous Infusions:  PRN Meds:.oxyCODONE  Antibiotics    Anti-infectives   None       Time Spent in minutes   30 minutes   Mousa Prout D.O. on 07/21/2013 at 11:43 AM  Between 7am to 7pm - Pager - [phone]  After 7pm go to [email or web address] - password [email or web address]  And look for the night coverage person covering for me after hours  [facility] Office  [phone]    Subjective:   Tonya Wallace seen and examined today.  Patient states she has no complaints this morning.    Objective:   Filed Vitals:   07/20/13 1802 07/20/13 2200 07/21/13 0423 07/21/13 0938  BP: 131/61 125/63 109/62 105/57  Pulse: 79 63 111 70  Temp: 98.1 F (36.7 C) 97.9 F (36.6 C) 99.4 F (37.4 C) 98.3 F (36.8 C)  TempSrc: Oral Oral Oral   Resp: 18 18 36 20  Height:      Weight:      SpO2: 100% 100%  95% 100%    Wt Readings from Last 3 Encounters:  07/20/13 47.854 kg (105 lb 8 oz)  12/07/12 63.1 kg (139 lb 1.8 oz)  12/07/12 63.1 kg (139 lb 1.8 oz)     Intake/Output Summary (Last 24 hours) at 07/21/13 1143 Last data filed at 07/21/13 7829  Gross per 24 hour  Intake  470.5 ml  Output      0 ml  Net  470.5 ml    Exam  General: Well developed, malnourished, NAD, appears stated age  HEENT: NCAT, PERRLA, EOMI, Anicteic Sclera, mucous membranes dry. No pharyngeal erythema or exudates  Neck: Supple, no JVD, no masses  Cardiovascular: S1 S2 auscultated, no rubs, murmurs or gallops. Regular rate and rhythm.  Respiratory: Clear to auscultation bilaterally with equal chest rise  Abdomen: Soft, nontender, nondistended, + bowel sounds  Extremities: warm dry without cyanosis clubbing or edema  Neuro: AAOx3, cranial nerves grossly intact.   Skin: Without rashes exudates or nodules  Psych: Normal  affect and demeanor with intact judgement and insight  Data Review   Micro Results Recent Results (from the past 240 hour(s))  MRSA PCR SCREENING     Status: None   Collection Time    07/20/13  2:47 AM      Result Value Range Status   MRSA by PCR NEGATIVE  NEGATIVE Final   Comment:            The GeneXpert MRSA Assay (FDA     approved for NASAL specimens     only), is one component of a     comprehensive MRSA colonization     surveillance program. It is not     intended to diagnose MRSA     infection nor to guide or     monitor treatment for     MRSA infections.    Radiology Reports Ct Head Wo Contrast  07/19/2013   CLINICAL DATA:  altered mental status  EXAM: CT HEAD WITHOUT CONTRAST  TECHNIQUE: Contiguous axial images were obtained from the base of the skull through the vertex without intravenous contrast.  COMPARISON:  None.  FINDINGS: The cerebral and cerebellar hemispheres have a normal attenuation and morphology. No midline shift, ventriculomegaly, mass effect, evidence of mass lesion, intracranial hemorrhage or evidence of cortically based acute infarction. Gray-white matter differentiation is within normal limits throughout the brain. The paranasal sinuses and the mastoid air cells are clear. The skull is intact.  IMPRESSION: 1. No acute intracranial abnormalities.   Electronically Signed   By: Signa Kell M.D.   On: 07/19/2013 23:29   Dg Chest Port 1 View  07/19/2013   CLINICAL DATA:  Dehydration, nausea, shortness of breath.  EXAM: PORTABLE CHEST - 1 VIEW  COMPARISON:  Chest radiograph March 29, 2013  FINDINGS: Cardiac silhouette is upper limits of normal in size. Moderately calcified aortic knob. Mild chronic interstitial changes with left lung base scarring, no superimposed pleural effusions or focal consolidations. No pneumothorax.  Dual lumen right chest Port-A-Cath with distal catheter tip projecting distal superior vena cava. Multiple EKG lines overlie the patient  and may obscure subtle underlying pathology. Remote left lateral rib fractures. Status post lower thoracic and mid lumbar vertebroplasty. Coil material projecting in the abdomen suggests prior herniorrhaphy.  IMPRESSION: Borderline cardiomegaly, mild chronic interstitial changes without superimposed acute pulmonary process.   Electronically Signed   By: Awilda Metro   On: 07/19/2013 22:37    CBC  Recent Labs Lab 07/19/13 2304 07/20/13 0330  WBC 14.6* 9.9  HGB 9.1* 8.6*  HCT 26.3* 25.1*  PLT 94* 75*  MCV 79.0 80.2  MCH 27.3 27.5  MCHC 34.6 34.3  RDW 15.8* 16.0*  LYMPHSABS 0.6*  --   MONOABS 1.0  --   EOSABS 0.0  --   BASOSABS 0.0  --     Chemistries   Recent Labs Lab 07/19/13 2304 07/20/13 0330 07/21/13 0958  NA 125* 124* 122*  K 5.3* 5.4* 4.3  CL 93* 94* 92*  CO2 23 21 18*  GLUCOSE 351* 388* 459*  BUN 47* 47* 39*  CREATININE 1.88* 1.78* 1.63*  CALCIUM 7.9* 8.0* 8.0*  AST 18  --   --   ALT 35  --   --   ALKPHOS 393*  --   --   BILITOT 1.9*  --   --    ------------------------------------------------------------------------------------------------------------------ estimated creatinine clearance is 21.1 ml/min (by C-G formula based on Cr of 1.63). ------------------------------------------------------------------------------------------------------------------ No results found for this basename: HGBA1C,  in the last 72 hours ------------------------------------------------------------------------------------------------------------------ No results found for this basename: CHOL, HDL, LDLCALC, TRIG, CHOLHDL, LDLDIRECT,  in the last 72 hours ------------------------------------------------------------------------------------------------------------------  Recent Labs  07/20/13 0330  TSH 0.753   ------------------------------------------------------------------------------------------------------------------ No results found for this basename: VITAMINB12,  FOLATE, FERRITIN, TIBC, IRON, RETICCTPCT,  in the last 72 hours  Coagulation profile No results found for this basename: INR, PROTIME,  in the last 168 hours  No results found for this basename: DDIMER,  in the last 72 hours  Cardiac Enzymes No results found for this basename: CK, CKMB, TROPONINI, MYOGLOBIN,  in the last 168 hours ------------------------------------------------------------------------------------------------------------------ No components found with this basename: POCBNP,

## 2013-07-22 LAB — BASIC METABOLIC PANEL
CO2: 19 mEq/L (ref 19–32)
Calcium: 8.2 mg/dL — ABNORMAL LOW (ref 8.4–10.5)
Chloride: 92 mEq/L — ABNORMAL LOW (ref 96–112)
Creatinine, Ser: 1.55 mg/dL — ABNORMAL HIGH (ref 0.50–1.10)
GFR calc Af Amer: 36 mL/min — ABNORMAL LOW (ref >90)
Sodium: 125 mEq/L — ABNORMAL LOW (ref 135–145)

## 2013-07-22 LAB — GLUCOSE, CAPILLARY
Glucose-Capillary: 223 mg/dL — ABNORMAL HIGH (ref 70–99)
Glucose-Capillary: 266 mg/dL — ABNORMAL HIGH (ref 70–99)
Glucose-Capillary: 365 mg/dL — ABNORMAL HIGH (ref 70–99)
Glucose-Capillary: 540 mg/dL — ABNORMAL HIGH (ref 70–99)

## 2013-07-22 LAB — CBC
MCH: 27.8 pg (ref 26.0–34.0)
Platelets: DECREASED 10*3/uL (ref 150–400)
RBC: 3.56 MIL/uL — ABNORMAL LOW (ref 3.87–5.11)
RDW: 16 % — ABNORMAL HIGH (ref 11.5–15.5)
WBC: 8.6 10*3/uL (ref 4.0–10.5)

## 2013-07-22 MED ORDER — MAGIC MOUTHWASH W/LIDOCAINE
5.0000 mL | Freq: Four times a day (QID) | ORAL | Status: DC | PRN
  Administered 2013-07-22 – 2013-07-23 (×3): 5 mL via ORAL
  Filled 2013-07-22 (×4): qty 5

## 2013-07-22 MED ORDER — INSULIN ASPART 100 UNIT/ML ~~LOC~~ SOLN
0.0000 [IU] | Freq: Three times a day (TID) | SUBCUTANEOUS | Status: DC
Start: 2013-07-22 — End: 2013-07-23
  Administered 2013-07-22: 9 [IU] via SUBCUTANEOUS
  Administered 2013-07-22: 3 [IU] via SUBCUTANEOUS
  Administered 2013-07-22: 5 [IU] via SUBCUTANEOUS
  Administered 2013-07-23: 2 [IU] via SUBCUTANEOUS

## 2013-07-22 MED ORDER — INSULIN ASPART 100 UNIT/ML ~~LOC~~ SOLN
0.0000 [IU] | Freq: Every day | SUBCUTANEOUS | Status: DC
  Administered 2013-07-22: 2 [IU] via SUBCUTANEOUS

## 2013-07-22 MED ORDER — INSULIN ASPART 100 UNIT/ML ~~LOC~~ SOLN
8.0000 [IU] | Freq: Once | SUBCUTANEOUS | Status: AC
  Administered 2013-07-22: 8 [IU] via SUBCUTANEOUS

## 2013-07-22 MED ORDER — WHITE PETROLATUM GEL
Status: AC
  Administered 2013-07-22: 17:00:00
  Filled 2013-07-22: qty 5

## 2013-07-22 NOTE — Progress Notes (Signed)
[facility]                                                                                Patient Demographics  Tonya Wallace, is a 78 y.o. female, DOB - 12-Feb-1935, VHQ:[id number]  Admit date - 07/19/2013   Admitting Physician Houston Siren, MD  Outpatient Primary MD for the patient is Lyndon Code, MD  LOS - 3   Chief Complaint  Patient presents with  . Altered Mental Status        Assessment & Plan   Principal Problem:   Lethargy Active Problems:   Pancreatic cancer   Diabetes mellitus   Hypothyroidism   Dehydration   AKI (acute kidney injury)   Hyponatremia   Protein-calorie malnutrition, severe  Lethargy with Altered mental status -Likely multifactorial including sedatives versus dehydration -Patient currently back to baseline. -Will continue to monitor  Acute kidney injury with hyponatremia -Likely secondary to dehydration -Baseline creatinine was 1 -Current creatinine 1.55, trending downward -Avoid nephrotoxic agents -Will encourage PO intake  Protein-calorie malnutrition -Nutrition conuslted -Continue ensure  Hypothyroidism -Continue Synthroid -TSH 0.753  Diabetes mellitus -Continue insulin sliding scale with CBG monitoring  Pancreatic Cancer -Followed at [facility]  Code Status: Full  Family Communication: Son at bedside, spoke with husband via phone  Disposition Plan: Admitted, will likely discharge to home with hospice 12/30.   Procedures none  Consults  none  DVT Prophylaxis  Heparin  Lab Results  Component Value Date   PLT PLATELET CLUMPS NOTED ON SMEAR, COUNT APPEARS DECREASED 07/22/2013    Medications  Scheduled Meds: . aspirin  81 mg Oral Daily  . darifenacin  7.5 mg Oral Daily  . escitalopram  10 mg Oral Daily  . feeding supplement (ENSURE)  1 Container Oral BID PC  . feeding supplement (RESOURCE BREEZE)  1 Container Oral Q24H  . heparin  5,000 Units Subcutaneous Q8H  . insulin aspart   0-5 Units Subcutaneous QHS  . insulin aspart  0-9 Units Subcutaneous TID WC  . levothyroxine  112 mcg Oral QAC breakfast  . multivitamin with minerals  1 tablet Oral Daily  . nebivolol  5 mg Oral Daily  . sodium chloride  3 mL Intravenous Q12H   Continuous Infusions:  PRN Meds:.oxyCODONE  Antibiotics    Anti-infectives   None      Time Spent in minutes   30 minutes   Ursula Dermody D.O. on 07/22/2013 at 1:23 PM  Between 7am to 7pm - Pager - [phone]  After 7pm go to [email or web address] - password [email or web address]  And look for the night coverage person covering for me after hours  [facility] Office  [phone]    Subjective:   Jacklynn Lewis seen and examined today.  Patient states she has no complaints this morning.    Objective:   Filed Vitals:   07/21/13 1649 07/21/13 2125 07/22/13 0451 07/22/13 1000  BP: 124/56 97/42 100/55 90/56  Pulse: 62 88 68 80  Temp: 98.5 F (36.9 C) 98.4 F (36.9 C) 97.8 F (36.6 C) 97.8 F (36.6 C)  TempSrc:  Oral Oral Oral  Resp: 18 32 16 22  Height:  Weight:      SpO2: 100% 100% 95% 96%    Wt Readings from Last 3 Encounters:  07/20/13 47.854 kg (105 lb 8 oz)  12/07/12 63.1 kg (139 lb 1.8 oz)  12/07/12 63.1 kg (139 lb 1.8 oz)     Intake/Output Summary (Last 24 hours) at 07/22/13 1323 Last data filed at 07/22/13 0900  Gross per 24 hour  Intake  577.5 ml  Output      1 ml  Net  576.5 ml    Exam  General: Well developed, malnourished, NAD, appears stated age  HEENT: NCAT, PERRLA, EOMI, Anicteic Sclera, mucous membranes moist  Neck: Supple, no JVD, no masses  Cardiovascular: S1 S2 auscultated,  Regular rate and rhythm.  Respiratory: Clear to auscultation bilaterally with equal chest rise  Abdomen: Soft, nontender, nondistended, + bowel sounds  Extremities: warm dry without cyanosis clubbing or edema  Neuro: AAOx3, cranial nerves grossly intact.   Skin: Without rashes exudates or nodules  Psych:  Normal affect and demeanor with intact judgement and insight  Data Review   Micro Results Recent Results (from the past 240 hour(s))  MRSA PCR SCREENING     Status: None   Collection Time    07/20/13  2:47 AM      Result Value Range Status   MRSA by PCR NEGATIVE  NEGATIVE Final   Comment:            The GeneXpert MRSA Assay (FDA     approved for NASAL specimens     only), is one component of a     comprehensive MRSA colonization     surveillance program. It is not     intended to diagnose MRSA     infection nor to guide or     monitor treatment for     MRSA infections.    Radiology Reports Ct Head Wo Contrast  07/19/2013   CLINICAL DATA:  altered mental status  EXAM: CT HEAD WITHOUT CONTRAST  TECHNIQUE: Contiguous axial images were obtained from the base of the skull through the vertex without intravenous contrast.  COMPARISON:  None.  FINDINGS: The cerebral and cerebellar hemispheres have a normal attenuation and morphology. No midline shift, ventriculomegaly, mass effect, evidence of mass lesion, intracranial hemorrhage or evidence of cortically based acute infarction. Gray-white matter differentiation is within normal limits throughout the brain. The paranasal sinuses and the mastoid air cells are clear. The skull is intact.  IMPRESSION: 1. No acute intracranial abnormalities.   Electronically Signed   By: Signa Kell M.D.   On: 07/19/2013 23:29   Dg Chest Port 1 View  07/19/2013   CLINICAL DATA:  Dehydration, nausea, shortness of breath.  EXAM: PORTABLE CHEST - 1 VIEW  COMPARISON:  Chest radiograph March 29, 2013  FINDINGS: Cardiac silhouette is upper limits of normal in size. Moderately calcified aortic knob. Mild chronic interstitial changes with left lung base scarring, no superimposed pleural effusions or focal consolidations. No pneumothorax.  Dual lumen right chest Port-A-Cath with distal catheter tip projecting distal superior vena cava. Multiple EKG lines overlie the  patient and may obscure subtle underlying pathology. Remote left lateral rib fractures. Status post lower thoracic and mid lumbar vertebroplasty. Coil material projecting in the abdomen suggests prior herniorrhaphy.  IMPRESSION: Borderline cardiomegaly, mild chronic interstitial changes without superimposed acute pulmonary process.   Electronically Signed   By: Awilda Metro   On: 07/19/2013 22:37    CBC  Recent Labs Lab 07/19/13 2304 07/20/13 0330  07/22/13 0605  WBC 14.6* 9.9 8.6  HGB 9.1* 8.6* 9.9*  HCT 26.3* 25.1* 28.4*  PLT 94* 75* PLATELET CLUMPS NOTED ON SMEAR, COUNT APPEARS DECREASED  MCV 79.0 80.2 79.8  MCH 27.3 27.5 27.8  MCHC 34.6 34.3 34.9  RDW 15.8* 16.0* 16.0*  LYMPHSABS 0.6*  --   --   MONOABS 1.0  --   --   EOSABS 0.0  --   --   BASOSABS 0.0  --   --     Chemistries   Recent Labs Lab 07/19/13 2304 07/20/13 0330 07/21/13 0958 07/22/13 0605  NA 125* 124* 122* 125*  K 5.3* 5.4* 4.3 5.2*  CL 93* 94* 92* 92*  CO2 23 21 18* 19  GLUCOSE 351* 388* 459* 424*  BUN 47* 47* 39* 46*  CREATININE 1.88* 1.78* 1.63* 1.55*  CALCIUM 7.9* 8.0* 8.0* 8.2*  AST 18  --   --   --   ALT 35  --   --   --   ALKPHOS 393*  --   --   --   BILITOT 1.9*  --   --   --    ------------------------------------------------------------------------------------------------------------------ estimated creatinine clearance is 22.2 ml/min (by C-G formula based on Cr of 1.55). ------------------------------------------------------------------------------------------------------------------ No results found for this basename: HGBA1C,  in the last 72 hours ------------------------------------------------------------------------------------------------------------------ No results found for this basename: CHOL, HDL, LDLCALC, TRIG, CHOLHDL, LDLDIRECT,  in the last 72 hours ------------------------------------------------------------------------------------------------------------------  Recent  Labs  07/20/13 0330  TSH 0.753   ------------------------------------------------------------------------------------------------------------------ No results found for this basename: VITAMINB12, FOLATE, FERRITIN, TIBC, IRON, RETICCTPCT,  in the last 72 hours  Coagulation profile No results found for this basename: INR, PROTIME,  in the last 168 hours  No results found for this basename: DDIMER,  in the last 72 hours  Cardiac Enzymes No results found for this basename: CK, CKMB, TROPONINI, MYOGLOBIN,  in the last 168 hours ------------------------------------------------------------------------------------------------------------------ No components found with this basename: POCBNP,

## 2013-07-22 NOTE — Evaluation (Signed)
Physical Therapy Evaluation Patient Details Name: Tonya Wallace MRN: [id number] DOB: [birth year] Today's Date: 07/22/2013 Time: 5366-4403 PT Time Calculation (min): 23 min  PT Assessment / Plan / Recommendation History of Present Illness  [place] H Jezek is an 78 y.o. female with hx of pancreatic cancer, s/p prior chemotherapy, with plan to follow up for experimental therapy at [facility], hx of DM, HTN, cardiomyopathy, anxiety, hypothyroidism, brought in to the ER by husband and family as she has been more lethargic for the past 2 days. Husband said she has not been eating or drinking as much. Upon close questioning, there may have been confusion in her medications which she took. He thought she may have taken more pain meds than usual, though he was not sure. There has been no seizure activities, no fever, chills, or coughs. She has been lethargic, but easily arousable. Evalaution in the ER included a head CT which showed no acute process, a CXR showed some vascular congestion, but no infiltrate, a WBC of 14.6K (on decadron), Hb of 9.1, and BUN/ Cr elevation at 47/ 1.9. Her sodium is low at 125, which is new. She appears clinically dehydrated, and was lethargic. Her NH3 level was normal. Hospitalist was asked to admit her for volume depletion, hyponatremia, and AKI.   Clinical Impression  Pt adm due to the above. Presents with decreased independence with functional mobility due to deficits indicated below. Pt limited in evaluation due to lethargy. Pt to benefit from skilled PT to increase independence with mobility prior to returning home with family. Patient's son present to provide PLOF. Pt son, pt was independent with mobility prior to hospital admission. Will benefit from HHPT upon acute D/C to improve quality of life and decrease caregiver burden.     PT Assessment  Patient needs continued PT services    Follow Up Recommendations  Home health PT;Supervision/Assistance - 24 hour    Does  the patient have the potential to tolerate intense rehabilitation      Barriers to Discharge        Equipment Recommendations  None recommended by PT    Recommendations for Other Services     Frequency Min 3X/week    Precautions / Restrictions Precautions Precautions: Fall Restrictions Weight Bearing Restrictions: No   Pertinent Vitals/Pain No complaints.       Mobility  Bed Mobility Bed Mobility: Supine to Sit;Sitting - Scoot to [place] of Bed;Sit to Supine;Scooting to [facility] Supine to Sit: 4: Min assist;HOB elevated;With rails Sitting - Scoot to Edge of Bed: 4: Min assist;With rail Sit to Supine: 3: Mod assist;HOB flat Scooting to HOB: 1: +2 Total assist Scooting to [facility]: Patient Percentage: 0% Details for Bed Mobility Assistance: pt required max cues for hand placement and sequencing; pt very lethargic but could follow commands with incr time and tactile cues Transfers Transfers: Sit to Stand;Stand to Sit Sit to Stand: From bed;4: Min assist;With upper extremity assist Stand to Sit: 4: Min assist;To bed;With upper extremity assist Details for Transfer Assistance: pt requires (A) to achieve upright standing position and maintain balance; pt would open eyes with tactile cues but for decreased time; pt was not aware that she was standing at bedside; unsafe to ambulate at this time   Ambulation/Gait Ambulation/Gait Assistance: Not tested (comment) (pt lethargic and unsafe to ambulate ) Stairs: No Wheelchair Mobility Wheelchair Mobility: No         PT Diagnosis: Difficulty walking;Generalized weakness  PT Problem List: Decreased strength;Decreased activity tolerance;Decreased balance;Decreased mobility;Decreased cognition;Decreased  knowledge of use of DME;Decreased safety awareness PT Treatment Interventions: DME instruction;Gait training;Stair training;Functional mobility training;Therapeutic activities;Therapeutic exercise;Balance training;Neuromuscular  re-education;Patient/family education     PT Goals(Current goals can be found in the care plan section) Acute Rehab PT Goals Patient Stated Goal: none stated PT Goal Formulation: With patient/family Time For Goal Achievement: 08/05/13 Potential to Achieve Goals: Fair  Visit Information  Last PT Received On: 07/22/13 Assistance Needed: +2 (for ambulating ) History of Present Illness: Pt is 78 y.o. female adm from home due to lethargy        Prior Functioning  Home Living Family/patient expects to be discharged to:: Private residence Living Arrangements: Other (Comment);Spouse/significant other;Children (has 3 aides 24/7) Available Help at Discharge: Family;Personal care attendant;Available 24 hours/day Type of Home: House Home Access: Stairs to enter [employer] of Steps: 3 Entrance Stairs-Rails: Can reach both Home Layout: Two level;Able to live on main level with bedroom/bathroom Home Equipment: Dan Humphreys - 2 wheels;Shower seat - built in;Bedside commode;Wheelchair - manual Prior Function Level of Independence: Needs assistance Gait / Transfers Assistance Needed: son reports pt was ambulating without any type of AD  ADL's / Homemaking Assistance Needed: pt has aide who helps with bathing pt and if she needs help dressing  Comments: son present to provide history  Communication Communication: No difficulties Dominant Hand: Right    Cognition  Cognition Arousal/Alertness: Lethargic Behavior During Therapy: Flat affect Overall Cognitive Status: Difficult to assess Difficult to assess due to: Level of arousal    Extremity/Trunk Assessment Upper Extremity Assessment Upper Extremity Assessment: Difficult to assess due to impaired cognition Lower Extremity Assessment Lower Extremity Assessment: Difficult to assess due to impaired cognition Cervical / Trunk Assessment Cervical / Trunk Assessment: Kyphotic   Balance Balance Balance Assessed: Yes Static Sitting  Balance Static Sitting - Balance Support: Bilateral upper extremity supported;Feet supported Static Sitting - Level of Assistance: 4: Min assist;5: Stand by assistance Static Standing Balance Static Standing - Balance Support: Right upper extremity supported;During functional activity Static Standing - Level of Assistance: 4: Min assist  End of Session PT - End of Session Equipment Utilized During Treatment: Gait belt Activity Tolerance: Patient limited by lethargy Patient left: in bed;with call bell/phone within reach;with bed alarm set;with family/visitor present Nurse Communication: Mobility status;Precautions  GP     Donell Sievert, [place] [phone] 07/22/2013, 12:42 PM

## 2013-07-22 NOTE — Progress Notes (Signed)
   CARE MANAGEMENT NOTE 07/22/2013  Patient:  Tonya Wallace, Tonya Wallace   Account Number:  [id number]  Date Initiated:  07/22/2013  Documentation initiated by:  Darlyne Russian  Subjective/Objective Assessment:   admitted with AMS, increased lethargy, decreased  medical history: pancreatic cancer s/p chemo  lives at home with spouse and caregivers     Action/Plan:   progression of care and discharge planning  NCM to follow   Anticipated DC Date:  07/22/2013   Anticipated DC Plan:        DC Planning Services  CM consult      Choice offered to / List presented to:             Status of service:  In process, will continue to follow Medicare Important Message given?   (If response is "NO", the following Medicare IM given date fields will be blank) Date Medicare IM given:   Date Additional Medicare IM given:    Discharge Disposition:    Per UR Regulation:    If discussed at Long Length of Stay Meetings, dates discussed:    Comments:  07/22/2013  1130 Darlyne Russian RN, [place]  [phone]  Met with son regarding discharge planning. The patient has caregivers at home and he noted can increase the hours.  He will be talking with the MD about the plans for going home and if with hospice is needed.  NCM will continue to follow for disposition.

## 2013-07-22 NOTE — Progress Notes (Signed)
   CARE MANAGEMENT NOTE 07/22/2013  Patient:  Tonya Wallace, Tonya Wallace   Account Number:  [id number]  Date Initiated:  07/22/2013  Documentation initiated by:  Darlyne Russian  Subjective/Objective Assessment:   admitted with AMS, increased lethargy, decreased  medical history: pancreatic cancer s/p chemo  lives at home with spouse and caregivers     Action/Plan:   progression of care and discharge planning  NCM to follow  plan discharge to home with [facility] [facility]   Anticipated DC Date:  07/23/2013   Anticipated DC Plan:  HOME W HOSPICE CARE      DC Planning Services  CM consult      Choice offered to / List presented to:             [facility] agency  [facility]   Status of service:  Completed, signed off Medicare Important Message given?   (If response is "NO", the following Medicare IM given date fields will be blank) Date Medicare IM given:   Date Additional Medicare IM given:    Discharge Disposition:  HOME W HOSPICE CARE  Per UR Regulation:    If discussed at Long Length of Stay Meetings, dates discussed:    Comments:  Contact: spouse  [phone]  [facility]  [phone] called left voice mail message for Jeanann Lewandowsky to advise MD and NCM spoke with spouse and plan dc in the am  07/22/2013  [address] RN, [place]  [phone] spoke with spouse regarding plan for discharge home in the am with home hospice of [facility].  He had called them on the weekend.  Advised him NCM will f/u with Hospice.  He is requesting a hospital bed.  07/22/2013  [address] RN, [place] [phone] MD discussed with spouse and will plan discharge in am with hospice.  07/22/2013  1200 Darlyne Russian RN, [place]  [phone] rec'd call from [place]  [phone] [facility] the family called over the weekend with a referral for patient.  NCM is not aware of plan for hospice, will f/u with MD  07/22/2013  [address] RN, [place]  [phone]  Met with son regarding discharge planning. The patient has caregivers at home and he noted can increase the hours.  He will be talking with the MD about the plans for going home and if with hospice.  NCM will continue to follow for disposition.

## 2013-07-23 LAB — GLUCOSE, CAPILLARY: Glucose-Capillary: 177 mg/dL — ABNORMAL HIGH (ref 70–99)

## 2013-07-23 MED ORDER — FUROSEMIDE 40 MG PO TABS: 20.0000 mg | ORAL_TABLET | Freq: Every day | ORAL | Status: AC

## 2013-07-23 MED ORDER — BOOST / RESOURCE BREEZE PO LIQD: 1.0000 | ORAL | Status: AC

## 2013-07-23 MED ORDER — ENSURE PUDDING PO PUDG: 1.0000 | Freq: Two times a day (BID) | ORAL | Status: AC

## 2013-07-23 MED ORDER — MAGIC MOUTHWASH W/LIDOCAINE: 5.0000 mL | Freq: Four times a day (QID) | ORAL | Status: AC | PRN

## 2013-07-23 NOTE — Progress Notes (Signed)
Patient discharged.  Patient and family educated on follow-up appointments, discharge instructions, and discharge medications.  Patient verbalized understanding and AVS signed.  IV removed.  Telemetry discontinued and telemetry box 25 returned.  Patient to go home with home hospice.  Escorted down to car via wheelchair.  Prescriptions sent electronically to pharmacy.

## 2013-07-23 NOTE — Progress Notes (Signed)
[facility] [phone] to notify [facility] to home

## 2013-07-23 NOTE — Discharge Summary (Signed)
Physician Discharge Summary  Tonya Wallace AOZ:[id number] DOB: 12-06-1934 DOA: 07/19/2013  PCP: Lyndon Code, MD  Admit date: 07/19/2013 Discharge date: 07/23/2013  Time spent: 35 minutes  Recommendations for Outpatient Follow-up:  Patient will be discharged to home with hospice care. She should continue medications as prescribed. She should continue following up with me for sure pancreatic cancer as well as her primary care physician. Patient should have her lab work checked within one week.  Discharge Diagnoses:  Principal Problem:   Lethargy Active Problems:   Pancreatic cancer   Diabetes mellitus   Hypothyroidism   Dehydration   AKI (acute kidney injury)   Hyponatremia   Protein-calorie malnutrition, severe   Discharge Condition: Stable  Diet recommendation: Regular  Filed Weights   07/20/13 0209  Weight: 47.854 kg (105 lb 8 oz)    History of present illness:  Tonya Wallace is an 78 y.o. female with hx of pancreatic cancer, s/p prior chemotherapy, with plan to follow up for experimental therapy at [facility], hx of DM, HTN, cardiomyopathy, anxiety, hypothyroidism, brought in to the ER by husband and family as she has been more lethargic for the past 2 days. Husband said she has not been eating or drinking as much. Upon close questioning, there may have been confusion in her medications which she took. He thought she may have taken more pain meds than usual, though he was not sure. There has been no seizure activities, no fever, chills, or coughs. She has been lethargic, but easily arousable. Evalaution in the ER included a head CT which showed no acute process, a CXR showed some vascular congestion, but no infiltrate, a WBC of 14.6K (on decadron), Hb of 9.1, and BUN/ Cr elevation at 47/ 1.9. Her sodium is low at 125, which is new. She appears clinically dehydrated, and was lethargic. Her NH3 level was normal. Hospitalist was asked to admit her for volume depletion,  hyponatremia, and AKI.   Hospital Course:  This is a 78 year old with a history of pancreatic cancer status post chemotherapy who follows up for [facility], diabetes, hypertension, anxiety presented to the emergency department with altered mental status. Patient is under very lethargic upon admission.  Patient was admitted for altered mental status. This is likely secondary to multifactorial causes including sedatives, pain medications versus dehydration. Patient did return quickly back to her baseline. Her son was at bedside, as well as the husband. He did agree the patient was back to her baseline. CT of the head was negative for any intracranial abnormalities. Patient was also found to have acute kidney injury with hyponatremia upon admission. This is likely secondary to dehydration. Her creatinine has been trending downward. Patient will be discharged Lasix however the doses been changed 20 mg daily. This is something that should be discussed with her primary care physician. Patient was also noted to have 13-calorie malnutrition, nutrition was consulted and did recommend using Ensure. Patient does have history of hypothyroidism her TSH levels are to be 0.753, her Synthroid was continued. Patient also has a history of diabetes mellitus she was placed on insulin sliding scale with CBG monitoring. As for her pancreatic cancer, patient to continue following up [facility]. It is discussed with the patient's husband and he opted to take her home with hospice care. Patient will be discharged today with hospice with home care. Patient and husband do understand agree to the plan as noted above.  Procedures:  None  Consultations:  None  Discharge Exam: Filed Vitals:   07/23/13 0941  BP: 97/58  Pulse: 64  Temp: 97.8 F (36.6 C)  Resp: 20   Exam  General: Well developed, malnourished, NAD, appears stated age  HEENT: NCAT, PERRLA, EOMI, Anicteic Sclera, mucous membranes moist   Neck: Supple, no JVD, no masses  Cardiovascular: S1 S2 auscultated, Regular rate and rhythm.  Respiratory: Clear to auscultation bilaterally with equal chest rise  Abdomen: Soft, nontender, nondistended, + bowel sounds  Extremities: warm dry without cyanosis clubbing or edema  Neuro: AAOx3, cranial nerves grossly intact.  Skin: Without rashes exudates or nodules  Psych: Normal affect and demeanor with intact judgement and insight  Discharge Instructions  Discharge Orders   Future Orders Complete By Expires   Diet general  As directed    Discharge instructions  As directed    Comments:     Patient will be discharged to home with hospice care. She should continue medications as prescribed. She should continue following up with me for sure pancreatic cancer as well as her primary care physician.   Increase activity slowly  As directed        Medication List    STOP taking these medications       potassium chloride SA 20 MEQ tablet  Commonly known as:  K-DUR,KLOR-CON     zolpidem 10 MG tablet  Commonly known as:  AMBIEN      TAKE these medications       ARIPiprazole 5 MG tablet  Commonly known as:  ABILIFY  Take 5 mg by mouth daily.     aspirin 81 MG chewable tablet  Chew 1 tablet (81 mg total) by mouth daily.     clonazePAM 0.5 MG tablet  Commonly known as:  KLONOPIN  Take 0.5 mg by mouth 2 (two) times daily as needed for anxiety.     escitalopram 10 MG tablet  Commonly known as:  LEXAPRO  Take 10 mg by mouth daily.     feeding supplement (ENSURE) Pudg  Take 1 Container by mouth 2 (two) times daily after a meal.     feeding supplement (RESOURCE BREEZE) Liqd  Take 1 Container by mouth daily.     furosemide 40 MG tablet  Commonly known as:  LASIX  Take 0.5 tablets (20 mg total) by mouth daily.     levothyroxine 112 MCG tablet  Commonly known as:  SYNTHROID, LEVOTHROID  Take 112 mcg by mouth daily before breakfast.     lidocaine-prilocaine cream  Commonly  known as:  EMLA  Apply 1 application topically as needed (Apply 30 gm 45 minutes before port).     lipase/protease/amylase [address] UNITS Cpep capsule  Commonly known as:  CREON-12/PANCREASE  Take 1 capsule by mouth 3 (three) times daily with meals.     magic mouthwash Soln  Take 15 mLs by mouth 4 (four) times daily.     magic mouthwash w/lidocaine Soln  Take 5 mLs by mouth 4 (four) times daily as needed for mouth pain.     multivitamin with minerals Tabs tablet  Take 1 tablet by mouth daily.     nebivolol 5 MG tablet  Commonly known as:  BYSTOLIC  Take 5 mg by mouth daily.     ondansetron 8 MG tablet  Commonly known as:  ZOFRAN  Take by mouth every 8 (eight) hours as needed for nausea.     oxycodone 5 MG capsule  Commonly known as:  OXY-IR  Take 5 mg by mouth  every 4 (four) hours as needed for pain.     solifenacin 10 MG tablet  Commonly known as:  VESICARE  Take 10 mg by mouth daily.     traMADol 50 MG tablet  Commonly known as:  ULTRAM  Take 50 mg by mouth every 6 (six) hours as needed for pain.       Allergies  Allergen Reactions  . Fentanyl Itching       Follow-up Information   Follow up with Lyndon Code, MD. Schedule an appointment as soon as possible for a visit in 1 week.   Specialty:  Internal Medicine   Contact information:   [address] [place] [address] [phone]        The results of significant diagnostics from this hospitalization (including imaging, microbiology, ancillary and laboratory) are listed below for reference.    Significant Diagnostic Studies: Ct Head Wo Contrast  07/19/2013   CLINICAL DATA:  altered mental status  EXAM: CT HEAD WITHOUT CONTRAST  TECHNIQUE: Contiguous axial images were obtained from the base of the skull through the vertex without intravenous contrast.  COMPARISON:  None.  FINDINGS: The cerebral and cerebellar hemispheres have a normal attenuation and morphology. No midline shift, ventriculomegaly, mass  effect, evidence of mass lesion, intracranial hemorrhage or evidence of cortically based acute infarction. Gray-white matter differentiation is within normal limits throughout the brain. The paranasal sinuses and the mastoid air cells are clear. The skull is intact.  IMPRESSION: 1. No acute intracranial abnormalities.   Electronically Signed   By: Signa Kell M.D.   On: 07/19/2013 23:29   Dg Chest Port 1 View  07/19/2013   CLINICAL DATA:  Dehydration, nausea, shortness of breath.  EXAM: PORTABLE CHEST - 1 VIEW  COMPARISON:  Chest radiograph March 29, 2013  FINDINGS: Cardiac silhouette is upper limits of normal in size. Moderately calcified aortic knob. Mild chronic interstitial changes with left lung base scarring, no superimposed pleural effusions or focal consolidations. No pneumothorax.  Dual lumen right chest Port-A-Cath with distal catheter tip projecting distal superior vena cava. Multiple EKG lines overlie the patient and may obscure subtle underlying pathology. Remote left lateral rib fractures. Status post lower thoracic and mid lumbar vertebroplasty. Coil material projecting in the abdomen suggests prior herniorrhaphy.  IMPRESSION: Borderline cardiomegaly, mild chronic interstitial changes without superimposed acute pulmonary process.   Electronically Signed   By: Awilda Metro   On: 07/19/2013 22:37    Microbiology: Recent Results (from the past 240 hour(s))  MRSA PCR SCREENING     Status: None   Collection Time    07/20/13  2:47 AM      Result Value Range Status   MRSA by PCR NEGATIVE  NEGATIVE Final   Comment:            The GeneXpert MRSA Assay (FDA     approved for NASAL specimens     only), is one component of a     comprehensive MRSA colonization     surveillance program. It is not     intended to diagnose MRSA     infection nor to guide or     monitor treatment for     MRSA infections.     Labs: Basic Metabolic Panel:  Recent Labs Lab 07/19/13 2304  07/20/13 0330 07/21/13 0958 07/22/13 0605  NA 125* 124* 122* 125*  K 5.3* 5.4* 4.3 5.2*  CL 93* 94* 92* 92*  CO2 23 21 18* 19  GLUCOSE 351*  388* 459* 424*  BUN 47* 47* 39* 46*  CREATININE 1.88* 1.78* 1.63* 1.55*  CALCIUM 7.9* 8.0* 8.0* 8.2*   Liver Function Tests:  Recent Labs Lab 07/19/13 2304  AST 18  ALT 35  ALKPHOS 393*  BILITOT 1.9*  PROT 5.1*  ALBUMIN 1.6*    Recent Labs Lab 07/19/13 2304  LIPASE 5*    Recent Labs Lab 07/19/13 2304  AMMONIA 24   CBC:  Recent Labs Lab 07/19/13 2304 07/20/13 0330 07/22/13 0605  WBC 14.6* 9.9 8.6  NEUTROABS 13.0*  --   --   HGB 9.1* 8.6* 9.9*  HCT 26.3* 25.1* 28.4*  MCV 79.0 80.2 79.8  PLT 94* 75* PLATELET CLUMPS NOTED ON SMEAR, COUNT APPEARS DECREASED   Cardiac Enzymes: No results found for this basename: CKTOTAL, CKMB, CKMBINDEX, TROPONINI,  in the last 168 hours BNP: BNP (last 3 results)  Recent Labs  12/04/12 0400 12/06/12 0450 03/29/13 1234  PROBNP 20537.0* 20243.0* 1519.0*   CBG:  Recent Labs Lab 07/22/13 0745 07/22/13 1135 07/22/13 1633 07/22/13 2141 07/23/13 0746  GLUCAP 365* 266* 215* 223* 177*       Signed:  Yeraldi Fidler  [facility] 07/23/2013, 10:59 AM

## 2013-07-30 ENCOUNTER — Telehealth: Payer: Self-pay

## 2013-07-30 NOTE — Telephone Encounter (Signed)
Patient past away @ Home per [facility] in [place] [employer] °

## 2013-08-25 DEATH — deceased

## 2014-11-14 NOTE — Consult Note (Signed)
Reason for Visit: This 80 year old Female patient presents to the clinic for initial evaluation of .   Referred by [facility].  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 She underwent a CT scan on August 08, 2012 showing a large pancreatic head mass with severe intrahepatic and extrahepatic biliary ductal dilatation. The mass abutted the duodenum also encased the superiorsuperior mesenteric vein. Right colic vein also was encased an occluded. CT scan showed no evidence of distant metastatic disease. She had biliary tube placed on August 15, 2012 and brushings were highly suspicious for adenocarcinoma. CEA 19-9 was 139 with a total bilirubin of 13. She also had a exchange of her biliary catheter with repeat biopsy again showed no definitive evidence of adenocarcinoma. She is having significant abdominal pain and distention. She scheduled to see the physicians at [facility] again tomorrow with possibility of further biopsy. She is nowreferred to radiation oncology for consideration of treatment. Her brushings were highly suspicious for adenocarcinoma another attempt at biopsy may be obtained in the near future. referred close to home for consideration of treatment. she tends towards being sedentary. She is accompanied today by her daughter.  Past Hx:    Pancreatic Cancer:    Hypothyroidism:    Back Pain, Chronic:    Renal Insufficiency:    Restless Leg Syndrome:    Asthma:    Ovarian tumor 2001:    HTN:     Diabetes:    Right total knee replacement:    Knee Surgery - Right: 01-Aug-2011   Back Surgery:    Stent placement:    Kyphoplasty 03/2005:    Hysterectomy - Total:    Cholecystectomy:    Appendectomy:    gastric bypass:   Past, Family and Social History:  Past Medical History positive   Cardiovascular coronary artery disease; coronary artery stents; hypertension   Respiratory asthma   Endocrine diabetes mellitus; hypothyroidism   Past Surgical History appendectomy; cholecystectomy; kyphoplasty, hysterectomy, back surgery, right knee replacement, ovarian tumor 2001   Past Medical History Comments restless leg syndrome   Family History noncontributory   Social History noncontributory   Additional Past Medical and Surgical History accompanied by daughter today   Allergies:   Amoxicillin: GI Distress  Cipro: Itching  Morphine: Other  Oxycodone: Itching  Home Meds:  Home Medications: Medication Instructions Status  VESIcare 10 mg oral tablet 1 tab(s) orally once a day (at bedtime) Active  clonazepam 0.5 mg oral tablet 1 tab(s) orally 2 times a day as needed Active  amlodipine 10 mg oral tablet 1 tab(s) orally once a day (at bedtime) Active  Tekturna HCT 300 mg-12.5 mg oral tablet 1 tab(s) orally once a day (in the morning) Active  aspirin 81 mg oral tablet 1 tab(s) orally once a day Active  Centrum Silver 1 cap(s) orally once a day Active  magnesium/potassium/sodium sulfates 1 cap(s) orally once a day Active  Atarax 25 milligram(s) orally 3 times a day, As Needed- for Itching  Active  Abilify 5 mg oral tablet 0.5 tab(s) orally one  a day in the morning.  Active  Lexapro 10 mg oral tablet 1 tab(s) orally once a day Active   Review of Systems:  General negative   Performance Status (ECOG) 0   Skin negative   Breast negative   Ophthalmologic negative   ENMT negative   Respiratory and Thorax negative   Cardiovascular see HPI   Gastrointestinal  see HPI   Genitourinary negative   Musculoskeletal negative   Neurological negative   Psychiatric negative   Hematology/Lymphatics negative   Endocrine negative   Allergic/Immunologic negative   Review of Systems   according to nurse's notesPatient denies any weight loss, fatigue, weakness, fever, chills or night sweats. Patient denies any loss of vision, blurred vision. Patient denies any ringing  of the ears or hearing loss. No irregular heartbeat. Patient denies heart murmur or history of fainting. Patient denies any chest pain or pain radiating to her upper extremities. Patient denies any shortness of breath, difficulty breathing at night, cough or hemoptysis. Patient denies any swelling in the lower legs. Patient denies any nausea vomiting, vomiting of blood, or coffee ground material in the vomitus. Patient denies any stomach pain. Patient states has had normal bowel movements no significant constipation or diarrhea. Patient denies any dysuria, hematuria or significant nocturia. Patient denies any problems walking, swelling in the joints or loss of balance. Patient denies any skin changes, loss of hair or loss of weight. Patient denies any excessive worrying or anxiety or significant depression. Patient denies any problems with insomnia. Patient denies excessive thirst, polyuria, polydipsia. Patient denies any swollen glands, patient denies easy bruising or easy bleeding. Patient denies any recent infections, allergies or URI. Patient "s visual fields have not changed significantly in recent time.  Nursing Notes:  Nursing Vital Signs and Chemo Nursing Nursing Notes: *CC Vital Signs Flowsheet:   17-Feb-14 08:55  Temp Temperature 98.1  Pulse Pulse 63  Respirations Respirations 18  SBP SBP 136  DBP DBP 74  Pain Scale (0-10)  5 Right Upper Abdomen  Current Weight (kg) (kg) 65  Height (cm) centimeters 149.2  BSA (m2) 1.5   Physical Exam:  General/Skin/HEENT:  General normal    Skin normal   Eyes normal   ENMT normal   Head and Neck normal   Additional PE well-developed female with a markedly protuberant abdomen in moderate pain discomfort. Lungs are clear to A&P cardiac examination shows regular rate and rhythm. Abdomen is distended. Positive bowel sounds all 4 quadrants. Pain is elicited on deep palpation of her abdomen.   Breasts/Resp/CV/GI/GU:  Respiratory and Thorax normal   Cardiovascular normal   Gastrointestinal normal   Genitourinary normal   MS/Neuro/Psych/Lymph:  Musculoskeletal normal   Neurological normal   Lymphatics normal   Other Results:  Radiology Results: LabUnknown:    15-Jan-14 08:58, CT Abdomen With Contrast  PACS Image   CT:  CT Abdomen With Contrast   REASON FOR EXAM:    labs 1st abd pain jaundice attn to pancreas  COMMENTS:       PROCEDURE: KCT - KCT ABDOMEN STANDARD W  - Aug 08 2012  8:58AM     RESULT: History: Pain    Comparison: None    Technique: Multiple axial images of the abdomen were performed from the   lung bases to the iliac crests, with p.o. contrast and with 85 ml of   Isovue 300 intravenous contrast.      Findings:  The lung bases are clear. There is no pneumothorax. The  heart size is   normal.     The liver demonstrates no focal abnormality. The gallbladder is   unremarkable. The spleen demonstrates no focal abnormality. The kidneys   and adrenal glands are normal.     There is a heterogeneous pancreatic head mass measuring approximately 3.6    cm. There is mild surroundinginflammatory change. There is pancreatic   ductal dilatation. There is severe intrahepatic and extrahepatic biliary   ductal dilatation. There are numerous tiny subcentimeter retroperitoneal   lymph nodes.    There is evidence of prior gastric bypass. The visualized portions of the   duodenum, small intestine, and large intestine demonstrate no contrast     extravasation or dilatation. There is no pneumoperitoneum,  pneumatosis,   or portal venous gas. There is no abdominal free fluid.     The abdominal aorta is normal in caliber with atherosclerosis.    There is lumbar spine spondylosis. There is evidence of prior multilevel   thoracolumbar spine kyphoplasty.    IMPRESSION:     1. Large pancreatic head mass most concerning for pancreatic   adenocarcinoma. There is severe intrahepatic and extrahepatic biliary   ductal dilatation resulting from the pancreatic head mass. Oncology   consultation recommended.  Dictation Site: 1        Verified By: Jennette Banker, M.D., MD   Relevent Results:   Relevant Scans and Labs CT scans reviewed   Assessment and Plan: Impression:   tage III adenocarcinoma pancreatic head as yet no definitive adenocarcinoma diagnosis but highly suspicious for adenocarcinoma on brushings Plan:   at this time I discussed the case with Dr. Ma Hillock. Believe she would best be served by concurrent chemotherapy and radiation. I will proceed to start CT treatment planning. We will present her case her weekly tumor conference for advice possible having fine-needle aspiration done by radiology to obtain a definitive tissue diagnosis. We'll also await results of what [facility] plans on doing as far as further biopsy. I discussed the case with Dr. Ma Hillock believe she is a candidate for concurrent chemotherapy. I will plan on delivering 5400 cGy to her pancreatic mass using IMRT radiation therapy to the best avoid liver small bowel and spinal cord. Patient was set up for CT simulation later this week also was a new patient consultation was made with Dr. Ma Hillock.  I would like to take this opportunity to thank you for allowing me to continue to participate in this patient's care.  CC Referral:  cc: Dr. Clayborn Bigness   Electronic Signatures: Baruch Gouty, Roda Shutters (MD)  (Signed 17-Feb-14 12:53)  Authored: HPI, Diagnosis, Past Hx, PFSH, Allergies, Home Meds, ROS, Nursing Notes, Physical Exam,  Other Results, Relevent Results, Encounter Assessment and Plan, CC Referring Physician   Last Updated: 17-Feb-14 12:53 by Armstead Peaks (MD)

## 2014-11-16 NOTE — Op Note (Signed)
PATIENT NAME:  Tonya Wallace, Tonya Wallace MR#:  [id number] DATE OF BIRTH:  February 08, 1935  DATE OF PROCEDURE:  10/18/2011  PREOPERATIVE DIAGNOSIS: Comminuted left distal radius fracture.   POSTOPERATIVE DIAGNOSIS: Comminuted left distal radius fracture.   PROCEDURE: Open reduction internal fixation left distal radius with intra-articular involvement with multiple fragments of the distal fragment.   SURGEON: Laurene Footman, MD  ANESTHESIA: General.   DESCRIPTION OF PROCEDURE: Patient brought to the Operating Room and after adequate anesthesia was obtained, the left arm was prepped and draped in the usual sterile fashion with a tourniquet applied to the upper arm. After appropriate patient identification and timeout procedures were completed a volar approach was made over the FCR tendon. The FCR tendon was identified and retracted laterally with the radial artery protected and retracted to the radial side. The deep muscle was then elevated off the distal shaft of the radius. The distal fragment itself had this muscle avulsed and was quite displaced. Fingertrap traction was applied and approximately 10 pounds of traction given and this really helped at restoring length to the comminuted fracture. The distal fragment was then placed in appropriate alignment with a more extensive dissection over the radial styloid fragment which was separate from the main articular fragment on the radial side and ulnar side. Release of the brachial radialis gave better ability to restore alignment of the radial styloid. After adequate visualization and provisional alignment had been obtained the fracture was then kept in a reduced position and a volar plate applied from the [employer] DVR set. The plate was applied to the distal fragment close to the joint surface. Multiple smooth pegs inserted after drilling, measuring and placing the smooth pegs. Next, the plate was brought down to the shaft of the radius and two cortical screws were  placed. This gave are near anatomic alignment in both AP and lateral projections with restoration of length, radial tilt and some of the volar tilt as well. Acceptable reduction was felt to have been obtained. The tourniquet was let down at this point. Radial artery was intact. Hemostasis was checked with electrocautery. The wound was thoroughly irrigated and closed with 2-0 Vicryl for the deep subcutaneous tissue, a 4-0 Monocryl subcuticularly and Dermabond for the skin. 10 mL of 0.5% Sensorcaine was infiltrated into the area of the incision to aid in postoperative analgesia. Sterile dressings of 4 x 4's, Webril, and a volar splint were applied followed by an Ace wrap.   TOURNIQUET TIME: 31 minutes.   ESTIMATED BLOOD LOSS: 25 mL.   IMPLANTS: [employer] DVR short left narrow plate with multiple pegs and screws.   ____________________________ Laurene Footman, MD mjm:cms D: 10/18/2011 22:29:08 ET T: 10/19/2011 09:13:37 ET JOB#: [id number]  cc: Laurene Footman, MD, <Dictator> Laurene Footman MD ELECTRONICALLY SIGNED 10/20/2011 6:21

## 2014-11-16 NOTE — Discharge Summary (Signed)
PATIENT NAME:  Tonya Wallace, Tonya Wallace MR#:  [id number] DATE OF BIRTH:  1934/08/10  DATE OF ADMISSION:  08/01/2011 DATE OF DISCHARGE:  08/04/2011  ADMITTING DIAGNOSIS: Degenerative arthrosis of right knee.   DISCHARGE DIAGNOSIS: Degenerative arthrosis of right knee.   HISTORY: Patient is a pleasant 80 year old female who has been followed at [facility] for progression of right knee pain. She reported approximately a 2 to 3 year history of progressive knee pain. She had localized most of the pain along the medial aspect of the knee. Patient states that the pain was aggravated with weight-bearing activities. She had reported episodes of swelling as well as near giving way of the right knee. At the time of surgery on occasion she was using ambulatory aid to assist with her getting around. The patient had not seen any significant improvement in her condition despite activity modification, intra-articular cortisone injections as well as anti-inflammatory medications. The right knee pain had progressed to the point that it was significantly interfering with her activities of daily living. X-rays taken in [place] showed narrowing of the medial cartilage space with associated varus alignment. Bone-on-bone articulation was noted medially. Osteophyte as well as subchondral sclerosis was noted. After discussion of the risks and benefits of surgical intervention, the patient expressed her understanding of the risks and benefits and agreed for plans for surgical intervention.   PROCEDURE: Right total knee arthroplasty using computer-assisted navigation.   ANESTHESIA: Femoral nerve block with spinal.   SOFT TISSUE RELEASE: Anterior cruciate ligament, posterior cruciate ligament, deep medial collateral ligament, as well as the patellofemoral ligament.   IMPLANTS UTILIZED: DePuy PFC Sigma size 3 posterior stabilized femoral component (cemented), size 3 MBT tibial component (cemented), 32 mm three  pegged oval dome patella (cemented), and a 10 mm stabilized rotating platform polyethylene insert.   HOSPITAL COURSE: Patient tolerated the procedure very well. She had no complications. She was then taken to PAC-U where she was stabilized, then transferred to the orthopedic floor. Patient began receiving anticoagulation therapy of Lovenox 30 mg subcutaneous every 12 hours per anesthesia and pharmacy protocol. She was fitted with TED stockings bilaterally. These were allowed to be removed one hour per eight hour shift. The right one was applied on day two following removal of the Hemovac and dressing change. Patient was also fitted with the AV-I compression foot pumps bilaterally set at 130 mmHg. Patient's calves have been nontender. No evidence of any deep venous thromboses. The patient's heels were elevated off the bed using rolled towels. Heels have been nontender. There was no tissue breakdown noted.   Patient has denied any chest pain or shortness of breath. Vital signs have been stable. She has been afebrile. Hemodynamically she was stable. No transfusions were given other than the Autovac transfusion given the first six hours postoperatively.   Patient began receiving physical therapy for gait training and transfers. Upon being transported was ambulating greater than 400 feet. Occupational therapy was also initiated on day one for activities of daily living and assistive devices. She progressed very nicely. There have been no complications.   Patient's Hemovac, IV, and catheter were all removed on day two along with a dressing change. The wounds have been free of any drainage or any signs of infection.   Patient is being discharged to skilled nursing facility in improved stable condition.   DISCHARGE INSTRUCTIONS:  1. She will continue with physical therapy for gait training and transfers.  2. Occupational therapy for activities of daily living and  assistive devices.  3. Weight bear as  tolerated.  4. Continue with TED stockings bilaterally. These are allowed to be removed one hour per eight hour shift.  5. Incentive spirometry every hour while awake.  6. Encourage cough, deep breathing every two hours while awake.  7. Polar Care to the surgical leg maintaining a temperature of 40 to 50 degrees Fahrenheit.  8. She is placed on a regular diet.  9. Elevate heels off the bed.   FOLLOW UP: She has a follow-up appointment on 01/22 at 1:15 with Vance Peper, PA and 02/19 at 10:45 with Dr. Skip Estimable. She is to call the clinic sooner if she has any problems.   DRUG ALLERGIES: Amoxicillin, Cipro, morphine.   MEDICATIONS:  1. Synthroid 0.12 q.6 a.m.  2. Tylenol ES 500 to 1000 mg every four hours p.r.n. for temperatures for pain.  3. Celebrex 200 mg b.i.d. p.c.  4. Roxicodone 5 to 10 mg every four hours p.r.n. for pain.  5. Ultram 50 to 100 mg every four hours p.r.n. for pain.  6. Dulcolax suppositories 10 mg rectally daily p.r.n. for constipation.  7. Milk of magnesia 30 mL b.i.d. p.r.n.  8. Enema soapsuds if no results with milk of magnesia or Dulcolax p.r.n.  9. Mylanta DS 30 mL q.6h p.r.n.  10. Pantoprazole 40 mg twice a day.  11. Senokot-S 1 tablet b.i.d.  12. Lexapro 10 mg daily. 13. Fish oil 1 gram daily. 14. TriCor micronized 145 mg daily.  15. VESIcare 10 mg daily. 16. Bystolic 5 mg at bedtime. 17. Norvasc 10 mg at bedtime.  18. Aliskiren 300 mg daily.  19. Hydrochlorothiazide 12.5 mg daily.  20. Multivitamin capsule 1 per day. 21. Zolpidem CR 12.5 mg at bedtime. 22. Klonopin 0.5 mg b.i.d.  23. Requip 0.5 mg at bedtime.  24. Montelukast 10 mg at bedtime.  25. Magnesium oxide 400 mg daily.  26. Potassium chloride ER 20 mEq daily. 27. Abilify 2.5 mg daily.  28. Lovenox 30 mg subcutaneous every 12 hours for 14 days, then discontinue and begin taking one 81 mg enteric-coated aspirin.   PAST MEDICAL HISTORY:  1. Chickenpox.  2. Asthma.  3. Hypertension.   4. Hyperlipidemia.  5. History of diabetes mellitus.  6. Migraines. 7. Coronary artery disease. 8. Skin cancer on face.  9. Depression, anxiety.   ____________________________ Vance Peper, PA jrw:cms D: 08/04/2011 07:48:00 ET T: 08/04/2011 08:18:01 ET JOB#: [id number]  cc: Vance Peper, PA, <Dictator> JON WOLFE PA ELECTRONICALLY SIGNED 08/04/2011 21:55

## 2014-11-16 NOTE — Op Note (Signed)
PATIENT NAME:  Tonya Wallace, Tonya Wallace MR#:  [id number] DATE OF BIRTH:  12-07-1934  DATE OF PROCEDURE:  08/01/2011  PREOPERATIVE DIAGNOSIS: Degenerative arthrosis of the right knee.   POSTOPERATIVE DIAGNOSIS: Degenerative arthrosis of the right knee.   PROCEDURE PERFORMED: Right total knee arthroplasty using computer-assisted navigation.   SURGEON: Skip Estimable, M.D.   ASSISTANT: Vance Peper, PA-C (required to maintain retraction throughout the procedure).   ANESTHESIA: Femoral nerve block and spinal.   ESTIMATED BLOOD LOSS: 200 mL.  FLUIDS REPLACED: 1,200 mL of crystalloid.  TOURNIQUET TIME: 76 minutes.  DRAINS: Two medium drains to reinfusion system.  SOFT TISSUE RELEASES: Anterior cruciate ligament, posterior cruciate ligament, deep medial collateral ligament, patellofemoral ligament.   IMPLANTS UTILIZED: DePuy PFC Sigma size three posterior stabilized femoral component (cemented), size three MBT tibial component (cemented), 32 mm three peg oval dome patella (cemented), and a 10 mm stabilized rotating platform polyethylene insert.   INDICATIONS FOR SURGERY: The patient is a 80 year old female who has been seen for complaints of progressive right knee pain. X-rays demonstrate severe degenerative changes in tricompartmental fashion with relative varus deformity. After a discussion of the risks and benefits of surgical intervention, the patient expressed her understanding of the risks, benefits and agreed with plans for surgical intervention.   PROCEDURE IN DETAIL: The patient was brought into the Operating Room and, after adequate femoral nerve block and spinal anesthesia was achieved, a tourniquet was placed on the patient's upper right thigh. The patient's right knee and leg were cleaned and prepped with alcohol and DuraPrep and draped in the usual sterile fashion. A "time-out" was performed as per usual protocol. The right lower extremity was exsanguinated using an Esmarch, and the tourniquet  was inflated to 300 mmHg. An anterior longitudinal incision was made followed by a standard mid vastus approach. A moderate effusion was evacuated. Deep fibers of the medial collateral ligament were elevated in a subperiosteal fashion off the medial flare of the tibia so as to maintain a continuous soft tissue sleeve. The patella was subluxed laterally and the patellofemoral ligament was incised. Inspection of the knee demonstrated severe degenerative changes in tricompartmental fashion with eburnated bone noted to the medial compartment. Prominent osteophytes were debrided using a rongeur. Anterior and posterior cruciate ligaments were excised. Two 4.0 millimeter Schanz pins were inserted into the femur and into the tibia for attachment of the array of spheres used for computer-assisted navigation. Hip center was identified using a circumduction technique. Distal landmarks were mapped using the computer. The distal femur and proximal tibia were mapped using the computer. The distal femoral cutting guide was positioned using computer-assisted navigation so as to achieve 5 degrees distal valgus cut. Cut was performed and verified using the computer. The distal femur was sized and it was felt that a size three femoral component was appropriate. Size three cutting guide was positioned using computer-assisted navigation and the anterior cut was performed and verified using the computer. This was followed by completion of the posterior and chamfer cuts. Femoral cutting guide for the central box was then positioned and the central box cut was performed.   Attention was then directed to the proximal tibia. Medial and lateral menisci were excised. The extramedullary tibial cutting guide was positioned using computer-assisted navigation so as to achieve 0 degree varus valgus alignment and 0 degree posterior slope. Cut was performed and verified using the computer. The proximal tibia was sized and it was felt that a size  three tibial tray was appropriate. Tibial  and femoral trials were inserted followed by insertion of a 10 mm polyethylene trial. Excellent mediolateral soft tissue balancing was appreciated, both in full extension and in 90 degrees of flexion. Finally, patella was cut and prepared so as to accommodate a 32 mm three peg oval dome patella. Patellar trial was placed and the knee was placed through a range of motion with excellent patellar tracking appreciated.   Femoral component was removed. Central post hole for the tibial component was reamed followed by insertion of a keel punch. Tibial trial was then removed. The cut surfaces of bone were irrigated with copious amounts of normal saline with antibiotic solution using pulsatile lavage and then suctioned dry. Polymethyl methacrylate cement was prepared in the usual fashion using a vacuum mixer. Cement was applied to the cut surface of the proximal tibia as well as along the undersurface of a size three MBT tibial component. The tibial component was positioned and impacted into place. Excess cement was removed using freer elevators. Cement was then applied to the cut surface of the femur as well as along the posterior flanges of a size three posterior stabilized femoral component. Femoral component was positioned and impacted in place. Excess cement was removed using freer elevators. A 10 mm polyethylene trial was inserted and the knee was brought in full extension with steady axial compression applied. Finally, cement was applied to the backside of a 32 mm three peg oval dome patella and the patellar component was positioned and patellar clamp applied. Excess cement was removed using freer elevators.   After adequate curing of cement, the tourniquet was deflated after a total tourniquet time of 76 minutes. Hemostasis was achieved using electrocautery. The knee was irrigated with copious amounts of normal saline with antibiotic solution using pulsatile lavage and  then suctioned dry. The knee was then inspected for any residual cement debris. 30 mL of 0.25% Marcaine with epinephrine was injected along the posterior capsule. A 10 mm stabilized rotating platform polyethylene insert was inserted and the knee was placed through a range of motion with excellent patellar tracking appreciated and excellent mediolateral soft tissue balancing noted.   Two medium drains were placed in the wound bed and brought out through a separate stab incision to be attached to a reinfusion system. The medial parapatellar portion of the incision was reapproximated using interrupted sutures of #1 Vicryl. The subcutaneous tissue was approximated in layers using first #0 Vicryl followed by 2-0 Vicryl. Skin was closed with skin staples. Sterile dressing was applied.   The patient tolerated the procedure well. She was transported to the recovery room in stable condition.   ____________________________ Laurice Record. Holley Bouche., MD jph:ap D: 08/01/2011 17:00:26 ET T: 08/02/2011 09:32:08 ET JOB#: [id number]  cc: Jeneen Rinks P. Holley Bouche., MD, <Dictator> JAMES P Holley Bouche MD ELECTRONICALLY SIGNED 08/04/2011 6:08

## 2015-07-18 IMAGING — CT CT HEAD W/O CM
2 series · 16 of 30 positions shown, 20 images · non-contrast
Comparison: None.

CLINICAL DATA: altered mental status

EXAM:
CT HEAD WITHOUT CONTRAST
TECHNIQUE: Contiguous axial images were obtained from the base of the skull
through the vertex without intravenous contrast.

[Series 3: head w/o · axial · non-contrast · 0.49mm/px · z∈[+58,+183]mm · 13 of 31 slices shown, 17 images]
[im 3/31  brain]
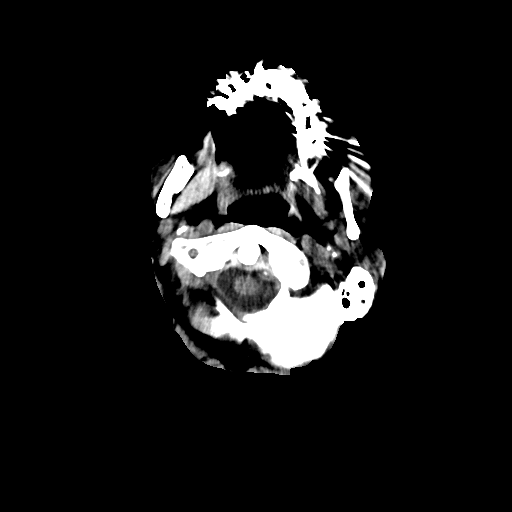
[im 3/31  bone]
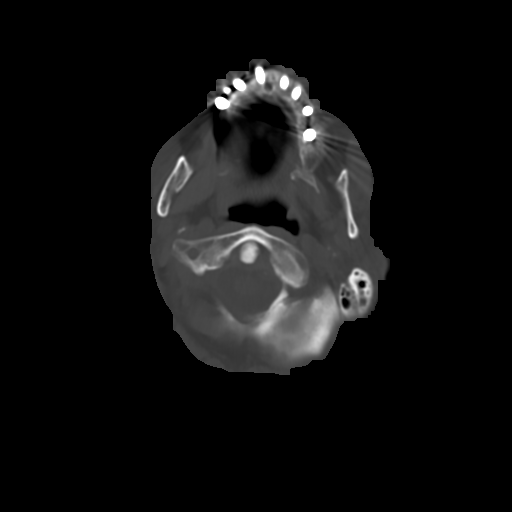
[im 5/31  brain]
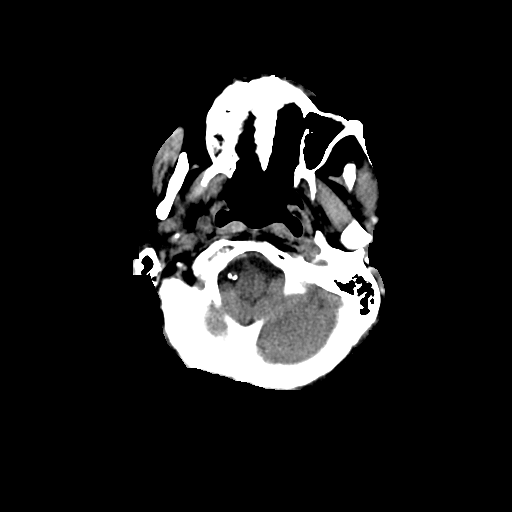
[im 7/31  brain]
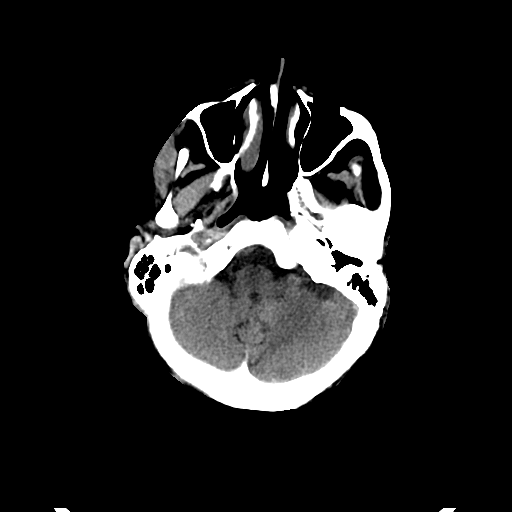
[im 9/31  brain]
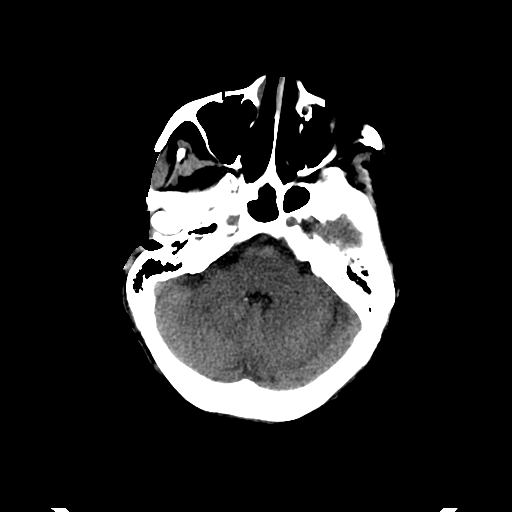
[im 11/31  brain]
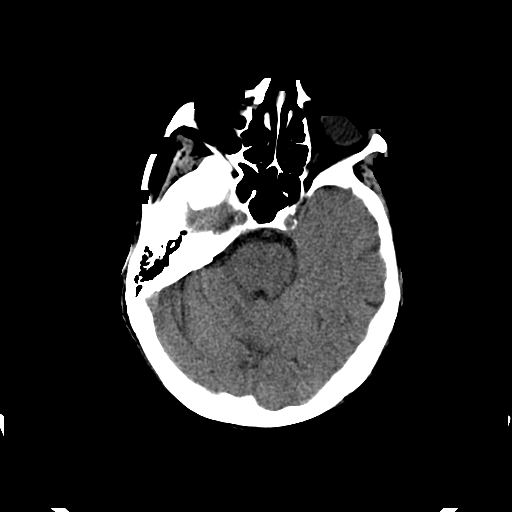
[im 11/31  bone]
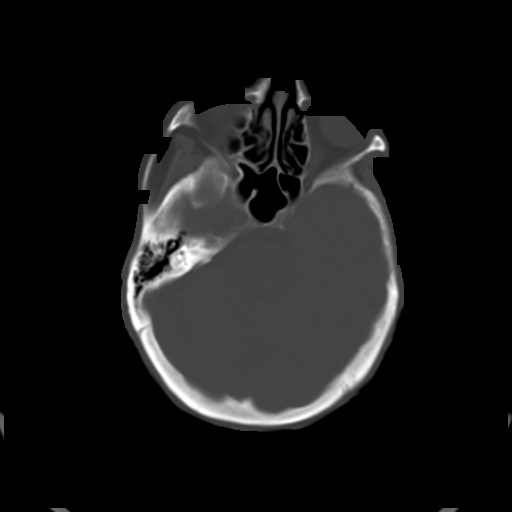
[im 13/31  brain]
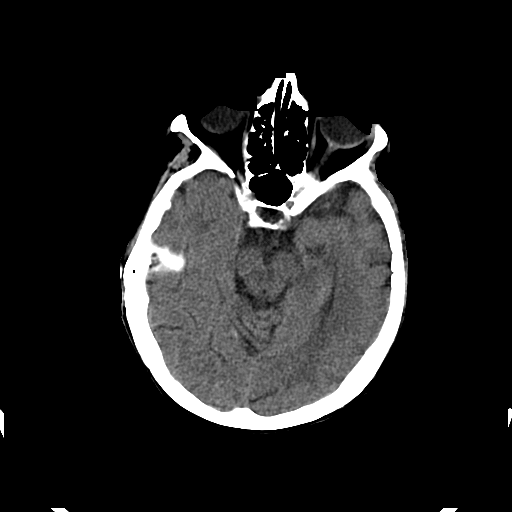
[im 16/31  brain]
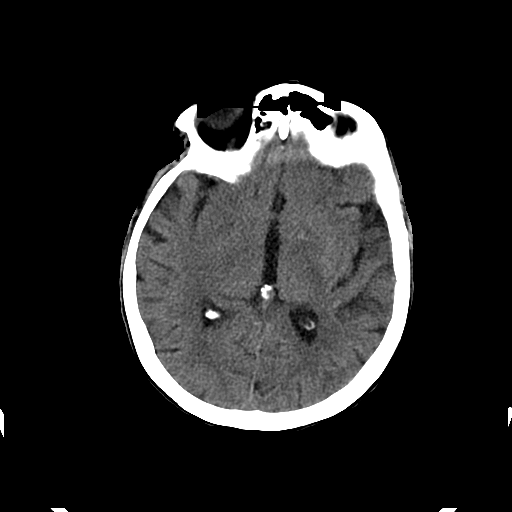
[im 18/31  brain]
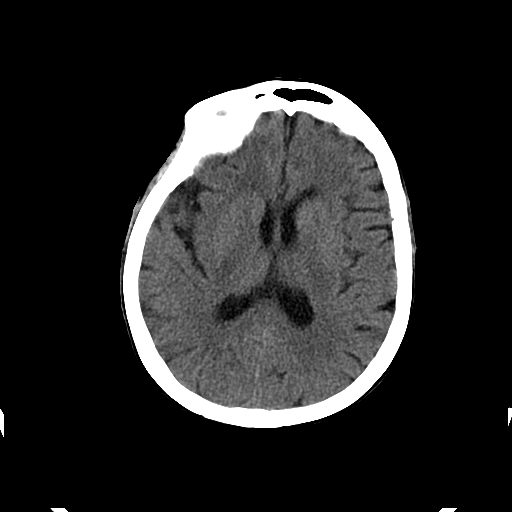
[im 20/31  brain]
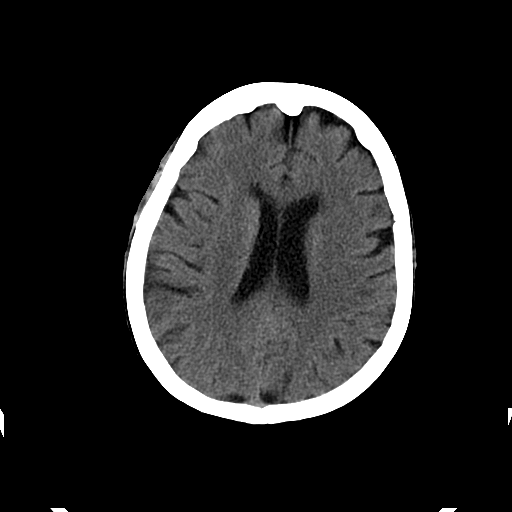
[im 20/31  bone]
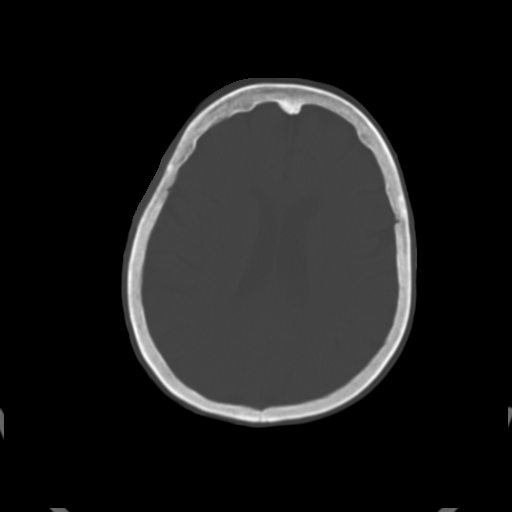
[im 22/31  brain]
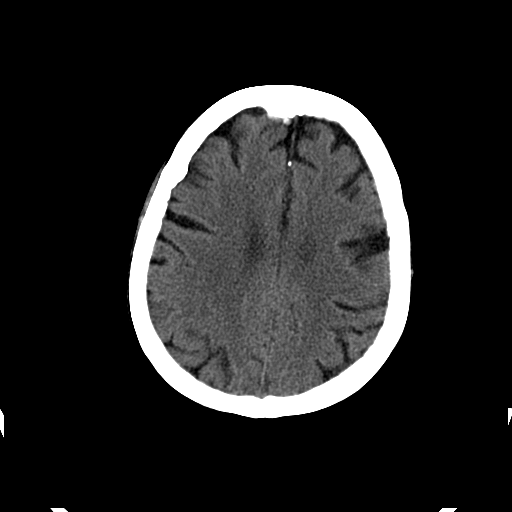
[im 24/31  brain]
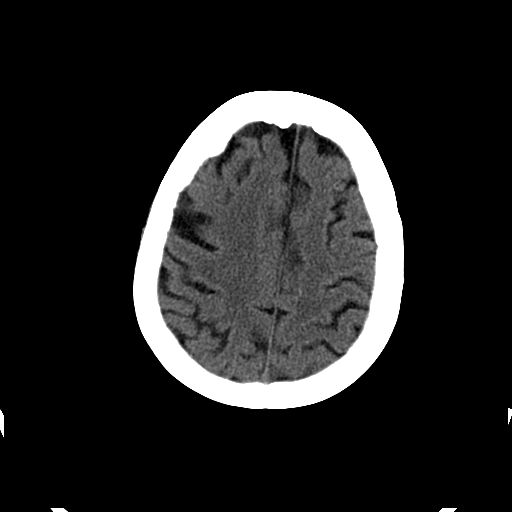
[im 26/31  brain]
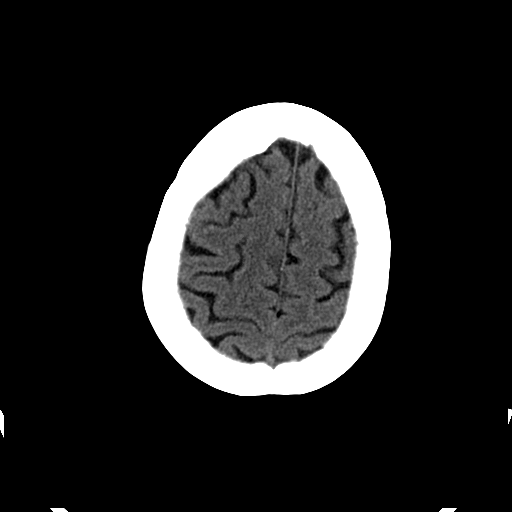
[im 28/31  brain]
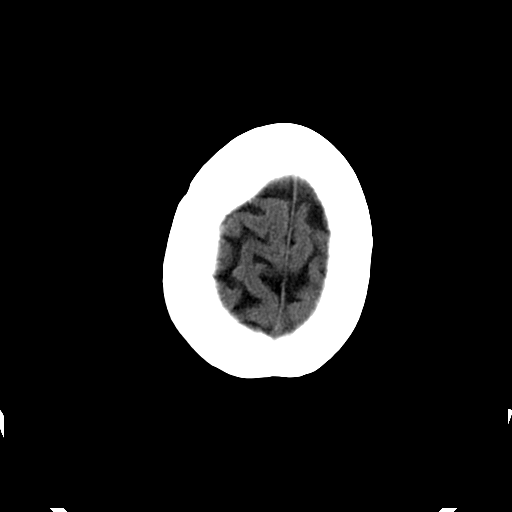
[im 28/31  bone]
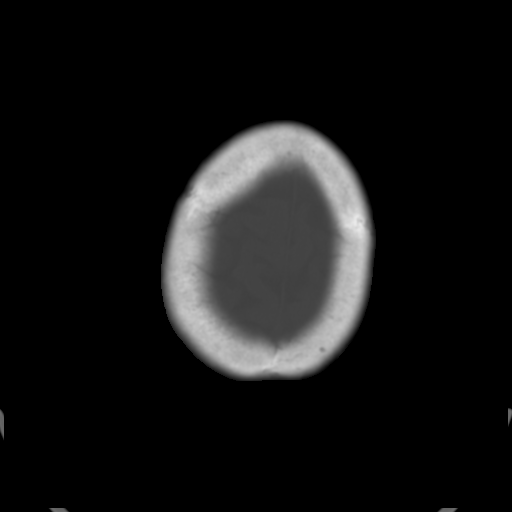

[Series 4: head w/o bone · axial · non-contrast · 0.49mm/px · z∈[+58,+98]mm · 3 of 31 slices shown]
[im 3/31  bone]
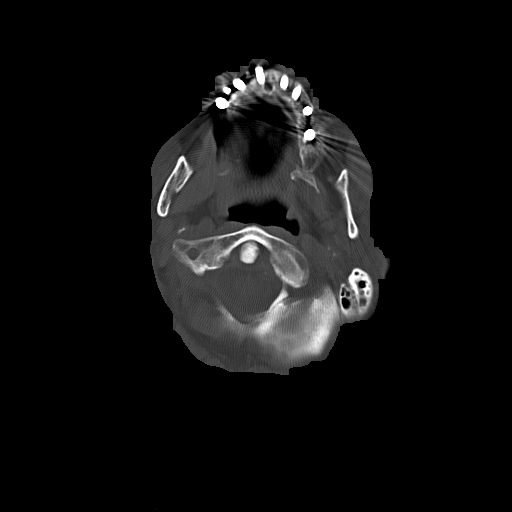
[im 7/31  bone]
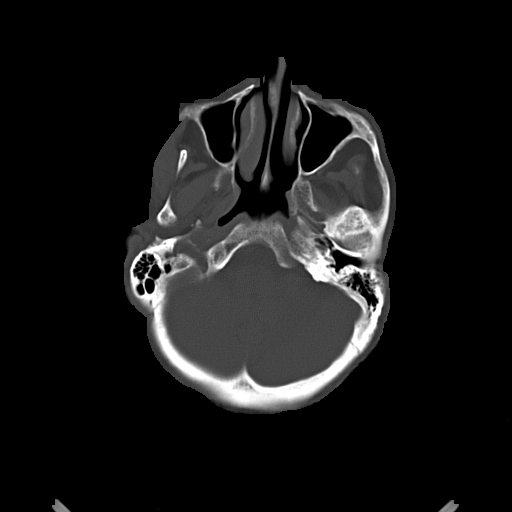
[im 11/31  bone]
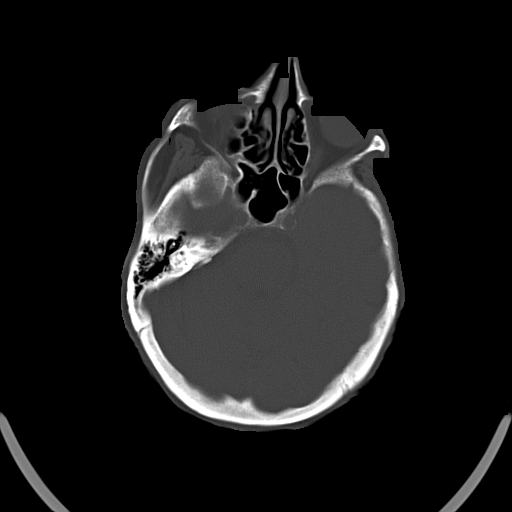

[16 of 30 positions shown; findings below may reference images not displayed]

FINDINGS: The cerebral and cerebellar hemispheres have a normal attenuation
and morphology. No midline shift, ventriculomegaly, mass effect,
evidence of mass lesion, intracranial hemorrhage or evidence of
cortically based acute infarction. Gray-white matter differentiation
is within normal limits throughout the brain. The paranasal sinuses
and the mastoid air cells are clear. The skull is intact.
IMPRESSION: 1. No acute intracranial abnormalities.

## 2015-07-18 IMAGING — CR DG CHEST 1V PORT
1 series · 1 of 1 positions shown · non-contrast
Comparison: Chest radiograph March 29, 2013

CLINICAL DATA: Dehydration, nausea, shortness of breath.

EXAM:
PORTABLE CHEST - 1 VIEW

[AP]
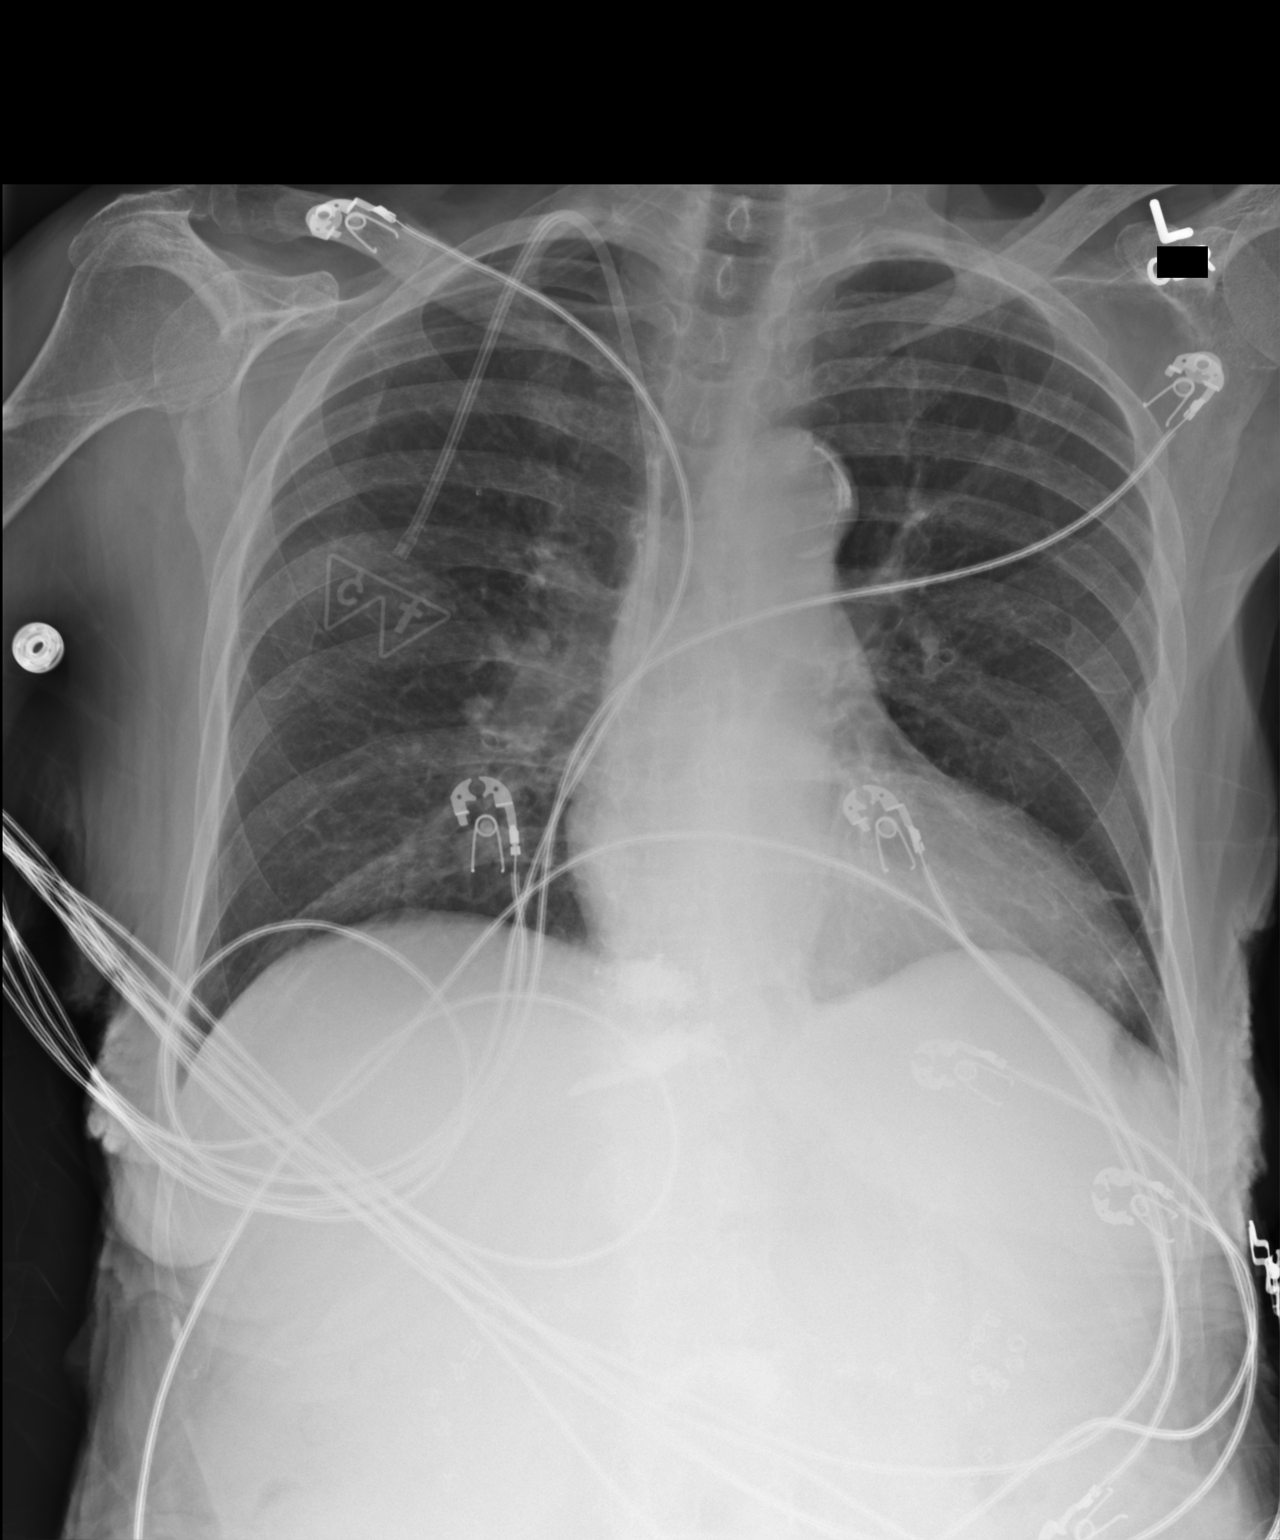

[1 of 1 positions shown; findings below may reference images not displayed]

FINDINGS: Cardiac silhouette is upper limits of normal in size. Moderately
calcified aortic knob. Mild chronic interstitial changes with left
lung base scarring, no superimposed pleural effusions or focal
consolidations. No pneumothorax.

Dual lumen right chest Port-A-Cath with distal catheter tip
projecting distal superior vena cava. Multiple EKG lines overlie the
patient and may obscure subtle underlying pathology. Remote left
lateral rib fractures. Status post lower thoracic and mid lumbar
vertebroplasty. Coil material projecting in the abdomen suggests
prior herniorrhaphy.
IMPRESSION: Borderline cardiomegaly, mild chronic interstitial changes without
superimposed acute pulmonary process.

  By: Ciera Chevez
